# Patient Record
Sex: Male | Born: 1965 | Race: White | Hispanic: No | Marital: Married | Smoking: Never smoker
Health system: Southern US, Community
[De-identification: ages and names within clinical notes are randomized; demographics above are authoritative.]

## PROBLEM LIST (undated history)

## (undated) DIAGNOSIS — R42 Dizziness and giddiness: Secondary | ICD-10-CM

## (undated) DIAGNOSIS — I10 Essential (primary) hypertension: Secondary | ICD-10-CM

## (undated) DIAGNOSIS — E349 Endocrine disorder, unspecified: Secondary | ICD-10-CM

## (undated) DIAGNOSIS — G4482 Headache associated with sexual activity: Secondary | ICD-10-CM

## (undated) DIAGNOSIS — Z87442 Personal history of urinary calculi: Secondary | ICD-10-CM

## (undated) DIAGNOSIS — K589 Irritable bowel syndrome without diarrhea: Secondary | ICD-10-CM

## (undated) DIAGNOSIS — N200 Calculus of kidney: Secondary | ICD-10-CM

## (undated) DIAGNOSIS — R112 Nausea with vomiting, unspecified: Secondary | ICD-10-CM

## (undated) DIAGNOSIS — R51 Headache: Secondary | ICD-10-CM

## (undated) DIAGNOSIS — H9190 Unspecified hearing loss, unspecified ear: Secondary | ICD-10-CM

## (undated) DIAGNOSIS — G43109 Migraine with aura, not intractable, without status migrainosus: Secondary | ICD-10-CM

## (undated) HISTORY — DX: Dizziness and giddiness: R42

## (undated) HISTORY — DX: Headache: R51

## (undated) HISTORY — DX: Endocrine disorder, unspecified: E34.9

## (undated) HISTORY — PX: OTHER SURGICAL HISTORY: SHX169

## (undated) HISTORY — PX: ANTERIOR CRUCIATE LIGAMENT REPAIR: SHX115

## (undated) HISTORY — PX: HIP SURGERY: SHX245

## (undated) HISTORY — PX: KNEE SURGERY: SHX244

## (undated) HISTORY — DX: Essential (primary) hypertension: I10

## (undated) HISTORY — PX: EYE SURGERY: SHX253

## (undated) HISTORY — DX: Unspecified hearing loss, unspecified ear: H91.90

## (undated) HISTORY — DX: Headache associated with sexual activity: G44.82

## (undated) HISTORY — DX: Irritable bowel syndrome without diarrhea: K58.9

## (undated) HISTORY — DX: Calculus of kidney: N20.0

## (undated) HISTORY — PX: SHOULDER SURGERY: SHX246

## (undated) HISTORY — DX: Migraine with aura, not intractable, without status migrainosus: G43.109

---

## 2001-08-25 ENCOUNTER — Encounter: Payer: Self-pay | Admitting: Emergency Medicine

## 2001-08-25 ENCOUNTER — Emergency Department (HOSPITAL_COMMUNITY): Admission: AC | Admit: 2001-08-25 | Discharge: 2001-08-25 | Payer: Self-pay

## 2003-02-14 ENCOUNTER — Emergency Department (HOSPITAL_COMMUNITY): Admission: EM | Admit: 2003-02-14 | Discharge: 2003-02-14 | Payer: Self-pay | Admitting: Emergency Medicine

## 2003-02-14 ENCOUNTER — Encounter: Payer: Self-pay | Admitting: *Deleted

## 2003-02-19 ENCOUNTER — Ambulatory Visit (HOSPITAL_BASED_OUTPATIENT_CLINIC_OR_DEPARTMENT_OTHER): Admission: RE | Admit: 2003-02-19 | Discharge: 2003-02-19 | Payer: Self-pay | Admitting: Orthopedic Surgery

## 2004-10-28 ENCOUNTER — Encounter: Admission: RE | Admit: 2004-10-28 | Discharge: 2004-10-28 | Payer: Self-pay | Admitting: Gastroenterology

## 2004-11-24 ENCOUNTER — Ambulatory Visit (HOSPITAL_COMMUNITY): Admission: RE | Admit: 2004-11-24 | Discharge: 2004-11-24 | Payer: Self-pay | Admitting: Gastroenterology

## 2005-03-01 ENCOUNTER — Encounter: Admission: RE | Admit: 2005-03-01 | Discharge: 2005-03-01 | Payer: Self-pay | Admitting: Gastroenterology

## 2006-01-15 ENCOUNTER — Emergency Department (HOSPITAL_COMMUNITY): Admission: EM | Admit: 2006-01-15 | Discharge: 2006-01-15 | Payer: Self-pay | Admitting: Emergency Medicine

## 2006-01-16 ENCOUNTER — Inpatient Hospital Stay (HOSPITAL_COMMUNITY): Admission: EM | Admit: 2006-01-16 | Discharge: 2006-01-19 | Payer: Self-pay | Admitting: Emergency Medicine

## 2006-05-19 ENCOUNTER — Ambulatory Visit (HOSPITAL_COMMUNITY): Admission: EM | Admit: 2006-05-19 | Discharge: 2006-05-19 | Payer: Self-pay | Admitting: Emergency Medicine

## 2006-05-21 ENCOUNTER — Ambulatory Visit (HOSPITAL_COMMUNITY): Admission: AD | Admit: 2006-05-21 | Discharge: 2006-05-21 | Payer: Self-pay | Admitting: Urology

## 2006-05-28 ENCOUNTER — Emergency Department (HOSPITAL_COMMUNITY): Admission: EM | Admit: 2006-05-28 | Discharge: 2006-05-28 | Payer: Self-pay | Admitting: Emergency Medicine

## 2013-11-10 ENCOUNTER — Encounter: Payer: Self-pay | Admitting: Neurology

## 2013-11-11 ENCOUNTER — Ambulatory Visit (INDEPENDENT_AMBULATORY_CARE_PROVIDER_SITE_OTHER): Payer: No Typology Code available for payment source | Admitting: Neurology

## 2013-11-11 ENCOUNTER — Encounter: Payer: Self-pay | Admitting: Neurology

## 2013-11-11 ENCOUNTER — Encounter (INDEPENDENT_AMBULATORY_CARE_PROVIDER_SITE_OTHER): Payer: Self-pay

## 2013-11-11 VITALS — BP 139/79 | HR 66 | Ht 71.0 in | Wt 194.0 lb

## 2013-11-11 DIAGNOSIS — G4482 Headache associated with sexual activity: Secondary | ICD-10-CM

## 2013-11-11 DIAGNOSIS — R42 Dizziness and giddiness: Secondary | ICD-10-CM

## 2013-11-11 DIAGNOSIS — G43109 Migraine with aura, not intractable, without status migrainosus: Secondary | ICD-10-CM | POA: Insufficient documentation

## 2013-11-11 HISTORY — DX: Headache associated with sexual activity: G44.82

## 2013-11-11 HISTORY — DX: Migraine with aura, not intractable, without status migrainosus: G43.109

## 2013-11-11 MED ORDER — SUMATRIPTAN SUCCINATE 6 MG/0.5ML ~~LOC~~ SOLN: 6.0000 mg | Freq: Two times a day (BID) | SUBCUTANEOUS | Status: AC | PRN

## 2013-11-11 NOTE — Patient Instructions (Signed)
Migraine Headache A migraine headache is an intense, throbbing pain on one or both sides of your head. A migraine can last for 30 minutes to several hours. CAUSES  The exact cause of a migraine headache is not always known. However, a migraine may be caused when nerves in the brain become irritated and release chemicals that cause inflammation. This causes pain. Certain things may also trigger migraines, such as:  Alcohol.  Smoking.  Stress.  Menstruation.  Aged cheeses.  Foods or drinks that contain nitrates, glutamate, aspartame, or tyramine.  Lack of sleep.  Chocolate.  Caffeine.  Hunger.  Physical exertion.  Fatigue.  Medicines used to treat chest pain (nitroglycerine), birth control pills, estrogen, and some blood pressure medicines. SIGNS AND SYMPTOMS  Pain on one or both sides of your head.  Pulsating or throbbing pain.  Severe pain that prevents daily activities.  Pain that is aggravated by any physical activity.  Nausea, vomiting, or both.  Dizziness.  Pain with exposure to bright lights, loud noises, or activity.  General sensitivity to bright lights, loud noises, or smells. Before you get a migraine, you may get warning signs that a migraine is coming (aura). An aura may include:  Seeing flashing lights.  Seeing bright spots, halos, or zig-zag lines.  Having tunnel vision or blurred vision.  Having feelings of numbness or tingling.  Having trouble talking.  Having muscle weakness. DIAGNOSIS  A migraine headache is often diagnosed based on:  Symptoms.  Physical exam.  A CT scan or MRI of your head. These imaging tests cannot diagnose migraines, but they can help rule out other causes of headaches. TREATMENT Medicines may be given for pain and nausea. Medicines can also be given to help prevent recurrent migraines.  HOME CARE INSTRUCTIONS  Only take over-the-counter or prescription medicines for pain or discomfort as directed by your  health care provider. The use of long-term narcotics is not recommended.  Lie down in a dark, quiet room when you have a migraine.  Keep a journal to find out what may trigger your migraine headaches. For example, write down:  What you eat and drink.  How much sleep you get.  Any change to your diet or medicines.  Limit alcohol consumption.  Quit smoking if you smoke.  Get 7 9 hours of sleep, or as recommended by your health care provider.  Limit stress.  Keep lights dim if bright lights bother you and make your migraines worse. SEEK IMMEDIATE MEDICAL CARE IF:   Your migraine becomes severe.  You have a fever.  You have a stiff neck.  You have vision loss.  You have muscular weakness or loss of muscle control.  You start losing your balance or have trouble walking.  You feel faint or pass out.  You have severe symptoms that are different from your first symptoms. MAKE SURE YOU:   Understand these instructions.  Will watch your condition.  Will get help right away if you are not doing well or get worse. Document Released: 10/09/2005 Document Revised: 07/30/2013 Document Reviewed: 06/16/2013 [employer] Patient Information 2014 [employer].  

## 2013-11-11 NOTE — Progress Notes (Signed)
Reason for visit: Migraine headache  Asencion GowdaJeffrey D Belote is a 48 y.o. male  History of present illness:  Mr. Cleotis NipperHolliday is a 48 year old left-handed white male with a history of migraine headaches that have been occurring throughout most of his adult life. The patient indicates that the headaches may occur only 3 or 4 times a year, but the headaches are quite severe when they do occur. The patient has visual aura with the headache associated with a visual scotoma, and scintillating scotoma. The patient will then get a severe headache 10 or 15 minutes after the aura begins. The patient will have some tingling and numb sensations around the mouth, sometimes into the hands. The patient will have nausea and vomiting. The headaches respond somewhat to Relpax within about an hour, but the patient has a dull headache lasting 2 or 3 days. The patient has no definite activating factors, but flashing lights and certain odors may occasionally bring on headaches. Over the last 6 months, the patient began having coital headaches. This does not occur every time, but about 50% of the time he will get headaches during sex. The patient has also developed episodes of vertigo unassociated with headache that have occurred 4 or 5 times over the last 6-8 months. Episodes may last 5 minutes up to 2 hours. The patient has had some decreased hearing, and this has been documented with audiometric testing with a significant difference from one ear to the next. The patient is sent to this office for further evaluation. The patient indicates that his father also has migraine headache.  Past Medical History  Diagnosis Date  . Headache(784.0)   . Dizziness   . Hearing loss     asymmetrical  . Classic migraine with aura 11/11/2013  . Renal calculi     Lithotripsy  . 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 Irritable bowel syndrome   . Testosterone deficiency     Past Surgical History  Procedure Laterality Date  . Knee surgery Left       x2 left knee  . Anterior cruciate ligament repair      x2  . Hip surgery Left   . Shoulder surgery      Bilateral  . Thumb surgery      Tendon repair, right    Family History  Problem Relation Age of Onset  . High blood pressure Mother   . Hypertension Mother   . High blood pressure Father   . Cancer - Prostate Father   . Skin cancer Father   . Hypertension Father   . Hypertension Brother   . Diabetes Brother     Social history:  reports that he has never smoked. He has never used smokeless tobacco. He reports that he does not drink alcohol or use illicit drugs.  Medications:  No current outpatient prescriptions on file prior to visit.   No current facility-administered medications on file prior to visit.      Allergies  Allergen Reactions  . Percocet [Oxycodone-Acetaminophen]     ROS:  Out of a complete 14 system review of symptoms, the patient complains only of the following symptoms, and all other reviewed systems are negative.  Fatigue Hearing loss, ringing in the ears, vertigo Blurred vision Snoring Diarrhea, irritable bowel syndrome Urination problems Headache, dizziness Decreased energy  Blood pressure 139/79, pulse 66, height 5\' 11"  (1.803 m), weight 194 lb (87.998 kg).  Physical Exam  General: The patient is alert and cooperative at the time of the  examination.  Eyes: Pupils are equal, round, and reactive to light. Discs are flat bilaterally.  Neck: The neck is supple, no carotid bruits are noted.  Respiratory: The respiratory examination is clear.  Cardiovascular: The cardiovascular examination reveals a regular rate and rhythm, no obvious murmurs or rubs are noted.  Neuromuscular: Range of movement of the cervical spine is full.  Skin: Extremities are without significant edema.  Neurologic Exam  Mental status: The patient is alert and oriented x 3 at the time of the examination. The patient has apparent normal recent and remote  memory, with an apparently normal attention span and concentration ability.  Cranial nerves: Facial symmetry is present. There is good sensation of the face to pinprick and soft touch bilaterally. The strength of the facial muscles and the muscles to head turning and shoulder shrug are normal bilaterally. Speech is well enunciated, no aphasia or dysarthria is noted. Extraocular movements are full. Visual fields are full. The tongue is midline, and the patient has symmetric elevation of the soft palate. No obvious hearing deficits are noted.  Motor: The motor testing reveals 5 over 5 strength of all 4 extremities. Good symmetric motor tone is noted throughout.  Sensory: Sensory testing is intact to pinprick, soft touch, vibration sensation, and position sense on all 4 extremities. No evidence of extinction is noted.  Coordination: Cerebellar testing reveals good finger-nose-finger and heel-to-shin bilaterally.  Gait and station: Gait is normal. Tandem gait is normal. Romberg is negative. No drift is seen.  Reflexes: Deep tendon reflexes are symmetric and normal bilaterally. Toes are downgoing bilaterally.   Assessment/Plan:  1. Classic migraine  2. Coital headache  3. Vertigo, decreased hearing  Given the symptoms of vertigo and decreased hearing, the patient will undergo MRI evaluation of the brain with and without gadolinium enhancement. The patient will be placed on Imitrex injection for the headache, as this is more likely to be effective than the Relpax. The patient is not to take Relpax with the Imitrex. The patient will followup in 4 months. The patient may take Advil as well with the headache. The patient may pretreat for the coital headaches with Advil.  Marlan Palau MD 11/11/2013 6:59 PM  [facility] [address] [place], [place] [address]  Phone [phone] Fax [phone]

## 2014-03-11 ENCOUNTER — Ambulatory Visit: Payer: No Typology Code available for payment source | Admitting: Nurse Practitioner

## 2017-02-28 ENCOUNTER — Other Ambulatory Visit: Payer: Self-pay | Admitting: Urology

## 2017-03-02 ENCOUNTER — Encounter (HOSPITAL_COMMUNITY): Payer: Self-pay | Admitting: General Practice

## 2017-03-02 NOTE — H&P (Signed)
Office Visit Report     02/27/2017   --------------------------------------------------------------------------------   Gary Fitzgerald  MRN: [id number]  PRIMARY CARE:  Shirlean Mylar, MD  DOB: [birth year], 51 year old Male  REFERRING:    SSN: -[id number]  PROVIDER:  Barron Alvine, M.D.    TREATING:  Anne Fu    LOCATION:  [facility] [phone]   --------------------------------------------------------------------------------   CC/HPI: Recent labs show PSA of 1.16 and testosterone 558.7. He has been experiencing low libido/sex drive and fatigue.   H/o stones. Today he is experiencing left sided flank pain and pressure, dull in nature, in which he noticed Sunday evening. He has had to take left over pain medication left over from last kidney stone that Dr. Isabel Caprice prescribed. He has had lithortripsy twice while other times he has been able to pass on his own. Pt denies any fever, chills, or gross hematuria. He has not had any recent imaging.     CC: I have kidney stones.  HPI: Gary Fitzgerald is a 51 year-old male established patient who is here for renal calculi.  C/o constant left flank pain. States pain feels like a KS and he has passed 30-40 in the past. He will take Vicodin and Flomax at onset of symptoms. He endorses tolerable relief. Pain is non-radiating. Not associated with worsening increase in LUTS.   The problem is on the left side. He first stated noticing pain on approximately 02/24/2017. This is not his first kidney stone. He has had more than 5 stones prior to getting this one. He is currently having flank pain. He denies having back pain, groin pain, nausea, vomiting, fever, and chills. He has not caught a stone in his urine strainer since his symptoms began.   He has had eswl for treatment of his stones in the past.     CC: interval evaluation of low testosterone  HPI: Recent PSA 1.16 with testosterone 556.    The patient was last seen 08/2016. The  patient is currently taking 150 mg Testosterone/0.5ml weekly for his testosterone replacement therapy. The patient complains of decreased libido, fatigue, and weakness. Over the past 6 months the patient denies any progressive voiding symptoms including worsening frequency, urgency, or dysuria.     ALLERGIES: Darvocet-N 100 TABS Percocet TABS    MEDICATIONS: Flomax 0.4 mg capsule, ext release 24 hr 1 capsule PO Daily  Alprazolam 0.25 mg tablet Oral  Cialis 5 mg tablet 0 Oral  Luer-Lok Syringe-Needle 22 gauge x 1 1/2" syringe, empty disposable 0 Does Not Apply  Luer-Lok Syringe-Needle 23 gauge x 1 1/2" syringe, empty disposable 0 Does Not Apply  Relpax 40 mg tablet Oral  Self-Righting Luer Tip Cap each 0 Does Not Apply  Tamsulosin HCl - 0.4 MG Oral Capsule 0 Oral  Trazodone Hcl 50 mg tablet Oral  Viberzi 100 mg tablet Oral  Vicodin 5 mg-300 mg tablet Oral     GU PSH: None     PSH Notes: Shoulder Surgery, Hip Surgery Right, Knee Surgery Left   NON-GU PSH: None   GU PMH: Renal calculus, Nephrolithiasis - 01/03/2016 Primary hypogonadism, Hypogonadism, 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 Peddie has a history of nephrolithiasis. He has had over 20 stone events. He usually passes his stones spontaneously, but he has required lithotripsy on two occasions. CT  in June 2012 showed a 2 mm left distal ureteral stone which he eventually passed and many non obstructing stones bilaterally, all measuring around 1-3 mm.    He was diagnosed with "low testosterone" by his PCP. He had a lack of motivation and low libido. He tried Androgel, but was switched to Testim by his insurance company. He had his labs drawn after using the gel (1 pack daily) and states his testosterone was still low but in the normal range. His PCP has increased his dose to 2  packs per day for 1 month to see if this increases his levels further. He does notice a slight increase in motivation and has an increase in libido at night after he uses the product. His main concern is that the Testim is quite sticky. He has done reading online and thinks he is interested in testosterone injections     NON-GU PMH: Encounter for general adult medical examination without abnormal findings, Encounter for preventive health examination - 01/03/2016 Psychogenic ED, Erectile disorder, acquired, generalized, moderate - 01/03/2016, Erectile disorder, acquired, generalized, moderate, - 01/03/2016 Personal history of other diseases of the nervous system and sense organs, History of migraine headaches - 2015 Asthma, Asthma - 2014 Gastric ulcer, unspecified as acute or chronic, without hemorrhage or perforation, Gastric Ulcer - 2014 Irritable bowel syndrome with diarrhea, Irritable Bowel Syndrome - 2014 Personal history of other diseases of the digestive system, History of esophageal reflux - 2014 Personal history of other endocrine, nutritional and metabolic disease, History of hyperlipidemia - 2014    FAMILY HISTORY: Diabetes - Runs In Family Family Health Status Number - Runs In Family Urologic Disorder - Mother, Brother, Runs In Family   SOCIAL HISTORY: Marital Status: Married Current Smoking Status: Patient has never smoked.  Does not drink anymore.  Does not drink caffeine.     Notes: Never A Smoker, Alcohol Use, Marital History - Currently Married, Caffeine Use, Occupation:, Tobacco Use   REVIEW OF SYSTEMS:    GU Review Male:   Patient reports stream starts and stops and leakage of urine. Patient denies erection problems, burning/ pain with urination, get up at night to urinate, trouble starting your stream, have to strain to urinate , hard to postpone urination, frequent urination, and penile pain.  Gastrointestinal (Upper):   Patient denies nausea, vomiting, and indigestion/  heartburn.  Gastrointestinal (Lower):   Patient reports diarrhea. Patient denies constipation.  Constitutional:   Patient reports fatigue. Patient denies fever, night sweats, and weight loss.  Skin:   Patient denies skin rash/ lesion and itching.  Eyes:   Patient denies blurred vision and double vision.  Ears/ Nose/ Throat:   Patient denies sore throat and sinus problems.  Hematologic/Lymphatic:   Patient denies swollen glands and easy bruising.  Cardiovascular:   Patient denies leg swelling and chest pains.  Respiratory:   Patient denies cough and shortness of breath.  Endocrine:   Patient denies excessive thirst.  Musculoskeletal:   Patient denies back pain and joint pain.  Neurological:   Patient denies headaches and dizziness.  Psychologic:   Patient denies depression and anxiety.   VITAL SIGNS:      02/27/2017 10:19 AM  Weight 200 lb / 90.72 kg  BP 128/83 mmHg  Pulse 74 /min  Temperature 97.9 F / 37 C   MULTI-SYSTEM PHYSICAL EXAMINATION:    Constitutional: Well-nourished. No physical deformities. Normally developed. Good grooming.  Neck: Neck symmetrical, not swollen. Normal tracheal position.  Respiratory: No labored breathing, no  use of accessory muscles. CTA.  Cardiovascular: Normal temperature, normal extremity pulses, no swelling, no varicosities. RRR.  Lymphatic: No enlargement of neck, axillae, groin.  Skin: No paleness, no jaundice, no cyanosis. No lesion, no ulcer, no rash.  Neurologic / Psychiatric: Oriented to time, oriented to place, oriented to person. No depression, no anxiety, no agitation.  Gastrointestinal: Abdominal tenderness. No mass, no rigidity, non obese abdomen. Left mid flank tenderness.  Musculoskeletal: Spine, ribs, pelvis no bilateral tenderness. Normal gait and station of head and neck.     PAST DATA REVIEWED:  Source Of History:  Patient  Lab Test Review:   PSA, Total Testosterone  Records Review:   Previous Patient Records  Urine Test Review:    Urinalysis  X-Ray Review: KUB: Reviewed Films.     02/19/17 12/28/15 11/18/14 11/06/13  PSA  Total PSA 1.16 ng/dl 5.621.37  1.301.22  8.651.10     78/46/9604/30/18 08/18/16 12/28/15 05/22/15 11/17/14 05/12/14 11/06/13 05/08/13  Hormones  Testosterone, Total 558.7 pg/dL 295.2160.9 pg/dL 841337  324617  [id number]  [id number]  [id number]  827     02/27/17  Urinalysis  Urine Appearance Clear   Urine Specimen Voided   Urine Color Yellow   Urine Glucose Neg   Urine Bilirubin Neg   Urine Ketones Neg   Urine Specific Gravity 1.025   Urine Blood Trace Intact   Urine pH 6.0   Urine Protein Neg   Urine Urobilinogen 0.2   Urine Nitrites Neg   Urine Leukocyte Esterase Neg   Urine WBC/hpf 0 - 5/hpf   Urine RBC/hpf 0 - 2/hpf   Urine Epithelial Cells 0 - 5/hpf   Urine Bacteria Rare   Urine Mucous Present   Urine Yeast NS (Not Seen)   Urine Trichomonas Not Present   Urine Cystals NS (Not Seen)   Urine Casts NS (Not Seen)   Urine Sperm Not Present    PROCEDURES:         KUB - [id number]  A single view of the abdomen is obtained.  Calculi:  Proximal left ureter calculi. Left Ureteral calcification size: 5.465mm x 11mm.      Non-obstructing bilateral calculi noted. No obvious boney abnormalities. Bowel gas pattern wnl. Bladder appears free of obstruction.         Urinalysis w/Scope - 81001 Dipstick Dipstick Cont'd Micro  Specimen: Voided Bilirubin: Neg WBC/hpf: 0 - 5/hpf  Color: Yellow Ketones: Neg RBC/hpf: 0 - 2/hpf  Appearance: Clear Blood: Trace Intact Bacteria: Rare  Specific Gravity: 1.025 Protein: Neg Cystals: NS (Not Seen)  pH: 6.0 Urobilinogen: 0.2 Casts: NS (Not Seen)  Glucose: Neg Nitrites: Neg Trichomonas: Not Present    Leukocyte Esterase: Neg Mucous: Present      Epithelial Cells: 0 - 5/hpf      Yeast: NS (Not Seen)      Sperm: Not Present    ASSESSMENT:      ICD-10 Details  1 GU:   Renal calculus - N20.0   2   Renal colic - N23   3   Primary hypogonadism - E29.1    PLAN:            Medications New Meds:  Hydrocodone-Acetaminophen 7.5 mg-300 mg tablet 1 tablet PO Q 4 H   #20  0 Refill(s)    Refill Meds: Testosterone Cypionate 200 MG/ML Intramuscular Solution Inject 1.0 ml weekly as directed   #4  3 Refill(s)            Orders Labs Urine Culture  X-Rays: KUB  X-Ray Notes: ...          Schedule Return Visit/Planned Activity: ASAP - Schedule Surgery          Document Letter(s):  Created for Patient: Clinical Summary         Notes:   We discussed continuing observation vs procedural intervention of his obvious obstructing stone in the left proximal ureter. The stone in question is clearly visible and he is symptomatic. He has had lithotripsy in the past. I think he will be a good candidate for this moving forward especially given the limited surgical availability is urologist. We did discuss the complications of shockwave lithotripsy including need for additional procedures, hematoma, obstructing fragments, and ongoing pain. Understanding expressed and he has agreed to proceed with ESWL. I have provided additional analgesia. I'm fine with him continuing to take tamsulosin. Due to the acute nature of his KS symptoms, we did not get to focus much on hypogonadism today. Testosterone and PSA wnl. I'll have a refill faxed for him today. This can be addressed further at next f/u after ESWL.    * Signed by Anne Fu on 02/27/17 at 1:19 PM (EDT)*     The information contained in this medical record document is considered private and confidential patient information. This information can only be used for the medical diagnosis and/or medical services that are being provided by the patient's selected caregivers. This information can only be distributed outside of the patient's care if the patient agrees and signs waivers of authorization for this information to be sent to an outside source or route.

## 2017-03-05 ENCOUNTER — Ambulatory Visit (HOSPITAL_COMMUNITY)
Admission: RE | Admit: 2017-03-05 | Discharge: 2017-03-05 | Disposition: A | Discharge status: Home / Self Care | Payer: Managed Care, Other (non HMO) | Source: Ambulatory Visit | Attending: Urology | Admitting: Urology

## 2017-03-05 ENCOUNTER — Ambulatory Visit (HOSPITAL_COMMUNITY): Payer: Managed Care, Other (non HMO)

## 2017-03-05 ENCOUNTER — Encounter (HOSPITAL_COMMUNITY): Payer: Self-pay

## 2017-03-05 ENCOUNTER — Encounter (HOSPITAL_COMMUNITY)
Admission: RE | Disposition: A | Discharge status: Home / Self Care | Payer: Self-pay | Source: Ambulatory Visit | Attending: Urology

## 2017-03-05 DIAGNOSIS — Z79899 Other long term (current) drug therapy: Secondary | ICD-10-CM | POA: Insufficient documentation

## 2017-03-05 DIAGNOSIS — E291 Testicular hypofunction: Secondary | ICD-10-CM | POA: Diagnosis not present

## 2017-03-05 DIAGNOSIS — N200 Calculus of kidney: Secondary | ICD-10-CM | POA: Diagnosis not present

## 2017-03-05 DIAGNOSIS — N201 Calculus of ureter: Secondary | ICD-10-CM

## 2017-03-05 HISTORY — DX: Personal history of urinary calculi: Z87.442

## 2017-03-05 HISTORY — PX: EXTRACORPOREAL SHOCK WAVE LITHOTRIPSY: SHX1557

## 2017-03-05 SURGERY — LITHOTRIPSY, ESWL
Anesthesia: LOCAL | Laterality: Left

## 2017-03-05 MED ORDER — SODIUM CHLORIDE 0.9 % IV SOLN
INTRAVENOUS | Status: DC
  Administered 2017-03-05: 10:00:00 via INTRAVENOUS

## 2017-03-05 MED ORDER — CIPROFLOXACIN HCL 500 MG PO TABS
500.0000 mg | ORAL_TABLET | ORAL | Status: AC
  Administered 2017-03-05: 500 mg via ORAL
  Filled 2017-03-05: qty 1

## 2017-03-05 MED ORDER — DIAZEPAM 5 MG PO TABS
10.0000 mg | ORAL_TABLET | ORAL | Status: AC
  Administered 2017-03-05: 10 mg via ORAL
  Filled 2017-03-05: qty 2

## 2017-03-05 MED ORDER — DIPHENHYDRAMINE HCL 25 MG PO CAPS
25.0000 mg | ORAL_CAPSULE | ORAL | Status: AC
  Administered 2017-03-05: 25 mg via ORAL
  Filled 2017-03-05: qty 1

## 2017-03-05 NOTE — Interval H&P Note (Signed)
History and Physical Interval Note:  03/05/2017 12:16 PM  Asencion GowdaJeffrey D Hitz  has presented today for surgery, with the diagnosis of LEFT URETERAL CALCULI  The various methods of treatment have been discussed with the patient and family. After consideration of risks, benefits and other options for treatment, the patient has consented to  Procedure(s): LEFT EXTRACORPOREAL SHOCK WAVE LITHOTRIPSY (ESWL) (Left) as a surgical intervention .  The patient's history has been reviewed, patient examined, no change in status, stable for surgery. He continues to have left flank pain. Stone stable on KUB in left proximal ureter.  He denies dysuria or fever. I discussed with the patient the nature, potential benefits, risks and alternatives to left proximal ESWL, including side effects of the proposed treatment, the likelihood of the patient achieving the goals of the procedure, and any potential problems that might occur during the procedure or recuperation. Patient elects to proceed. He said he had a stent before and did not like the "retrieval". Discussed with patient ESWL will only treat the left proximal stone, it will not touch the left and right renal stones. I reviewed the KUB with him. I have reviewed the patient's chart and labs. Questions were answered to the patient's satisfaction.     Emmanual Gauthreaux

## 2017-03-05 NOTE — Op Note (Signed)
Left proximal ESWL   Left 10 mm proximal stone  Findings; Stone stable/present on KUB. Limited fragmentation despite max power to 4 and slowing rate down. May need staged procedure. At about 3200 shocks stone appeared to smudge and spread somewhat.

## 2017-03-05 NOTE — Discharge Instructions (Signed)
Lithotripsy, Care After °This sheet gives you information about how to care for yourself after your procedure. Your health care provider may also give you more specific instructions. If you have problems or questions, contact your health care provider. °What can I expect after the procedure? °After the procedure, it is common to have: °· Some blood in your urine. This should only last for a few days. °· Soreness in your back, sides, or upper abdomen for a few days. °· Blotches or bruises on your back where the pressure wave entered the skin. °· Pain, discomfort, or nausea when pieces (fragments) of the kidney stone move through the tube that carries urine from the kidney to the bladder (ureter). Stone fragments may pass soon after the procedure, but they may continue to pass for up to 4-8 weeks. °? If you have severe pain or nausea, contact your health care provider. This may be caused by a large stone that was not broken up, and this may mean that you need more treatment. °· Some pain or discomfort during urination. °· Some pain or discomfort in the lower abdomen or (in men) at the base of the penis. ° °Follow these instructions at home: °Medicines °· Take over-the-counter and prescription medicines only as told by your health care provider. °· If you were prescribed an antibiotic medicine, take it as told by your health care provider. Do not stop taking the antibiotic even if you start to feel better. °· Do not drive for 24 hours if you were given a medicine to help you relax (sedative). °· Do not drive or use heavy machinery while taking prescription pain medicine. °Eating and drinking °· Drink enough water and fluids to keep your urine clear or pale yellow. This helps any remaining pieces of the stone to pass. It can also help prevent new stones from forming. °· Eat plenty of fresh fruits and vegetables. °· Follow instructions from your health care provider about eating and drinking restrictions. You may be  instructed: °? To reduce how much salt (sodium) you eat or drink. Check ingredients and nutrition facts on packaged foods and beverages. °? To reduce how much meat you eat. °· Eat the recommended amount of calcium for your age and gender. Ask your health care provider how much calcium you should have. °General instructions °· Get plenty of rest. °· Most people can resume normal activities 1-2 days after the procedure. Ask your health care provider what activities are safe for you. °· If directed, strain all urine through the strainer that was provided by your health care provider. °? Keep all fragments for your health care provider to see. Any stones that are found may be sent to a medical lab for examination. The stone may be as small as a grain of salt. °· Keep all follow-up visits as told by your health care provider. This is important. °Contact a health care provider if: °· You have pain that is severe or does not get better with medicine. °· You have nausea that is severe or does not go away. °· You have blood in your urine longer than your health care provider told you to expect. °· You have more blood in your urine. °· You have pain during urination that does not go away. °· You urinate more frequently than usual and this does not go away. °· You develop a rash or any other possible signs of an allergic reaction. °Get help right away if: °· You have severe pain in   your back, sides, or upper abdomen. °· You have severe pain while urinating. °· Your urine is very dark red. °· You have blood in your stool (feces). °· You cannot pass any urine at all. °· You feel a strong urge to urinate after emptying your bladder. °· You have a fever or chills. °· You develop shortness of breath, difficulty breathing, or chest pain. °· You have severe nausea that leads to persistent vomiting. °· You faint. °Summary °· After this procedure, it is common to have some pain, discomfort, or nausea when pieces (fragments) of the  kidney stone move through the tube that carries urine from the kidney to the bladder (ureter). If this pain or nausea is severe, however, you should contact your health care provider. °· Most people can resume normal activities 1-2 days after the procedure. Ask your health care provider what activities are safe for you. °· Drink enough water and fluids to keep your urine clear or pale yellow. This helps any remaining pieces of the stone to pass, and it can help prevent new stones from forming. °· If directed, strain your urine and keep all fragments for your health care provider to see. Fragments or stones may be as small as a grain of salt. °· Get help right away if you have severe pain in your back, sides, or upper abdomen or have severe pain while urinating. °This information is not intended to replace advice given to you by your health care provider. Make sure you discuss any questions you have with your health care provider. °Document Released: 10/29/2007 Document Revised: 08/30/2016 Document Reviewed: 08/30/2016 °Elsevier Interactive Patient Education © 2017 [employer] Inc. ° °

## 2017-03-06 ENCOUNTER — Encounter (HOSPITAL_COMMUNITY): Payer: Self-pay | Admitting: Urology

## 2017-11-21 ENCOUNTER — Emergency Department (HOSPITAL_COMMUNITY): Payer: Managed Care, Other (non HMO)

## 2017-11-21 ENCOUNTER — Other Ambulatory Visit: Payer: Self-pay

## 2017-11-21 ENCOUNTER — Emergency Department (HOSPITAL_COMMUNITY)
Admission: EM | Admit: 2017-11-21 | Discharge: 2017-11-21 | Disposition: A | Discharge status: Home / Self Care | Payer: Managed Care, Other (non HMO) | Attending: Emergency Medicine | Admitting: Emergency Medicine

## 2017-11-21 ENCOUNTER — Encounter (HOSPITAL_COMMUNITY): Payer: Self-pay | Admitting: Emergency Medicine

## 2017-11-21 DIAGNOSIS — N200 Calculus of kidney: Secondary | ICD-10-CM | POA: Diagnosis not present

## 2017-11-21 DIAGNOSIS — Z79899 Other long term (current) drug therapy: Secondary | ICD-10-CM | POA: Insufficient documentation

## 2017-11-21 DIAGNOSIS — R109 Unspecified abdominal pain: Secondary | ICD-10-CM | POA: Diagnosis present

## 2017-11-21 LAB — URINALYSIS, ROUTINE W REFLEX MICROSCOPIC
BILIRUBIN URINE: NEGATIVE
GLUCOSE, UA: NEGATIVE mg/dL
KETONES UR: NEGATIVE mg/dL
LEUKOCYTES UA: NEGATIVE
NITRITE: NEGATIVE
PROTEIN: NEGATIVE mg/dL
Specific Gravity, Urine: 1.023 (ref 1.005–1.030)
Squamous Epithelial / LPF: NONE SEEN
pH: 5 (ref 5.0–8.0)

## 2017-11-21 LAB — BASIC METABOLIC PANEL
Anion gap: 8 (ref 5–15)
BUN: 15 mg/dL (ref 6–20)
CALCIUM: 9.2 mg/dL (ref 8.9–10.3)
CO2: 25 mmol/L (ref 22–32)
Chloride: 105 mmol/L (ref 101–111)
Creatinine, Ser: 1.23 mg/dL (ref 0.61–1.24)
GFR calc Af Amer: >60 mL/min (ref >60)
GLUCOSE: 119 mg/dL — AB (ref 65–99)
Potassium: 4.6 mmol/L (ref 3.5–5.1)
Sodium: 138 mmol/L (ref 135–145)

## 2017-11-21 LAB — CBC
HCT: 45.5 % (ref 39.0–52.0)
Hemoglobin: 16.2 g/dL (ref 13.0–17.0)
MCH: 32.1 pg (ref 26.0–34.0)
MCHC: 35.6 g/dL (ref 30.0–36.0)
MCV: 90.1 fL (ref 78.0–100.0)
Platelets: 213 10*3/uL (ref 150–400)
RBC: 5.05 MIL/uL (ref 4.22–5.81)
RDW: 13.2 % (ref 11.5–15.5)
WBC: 16.6 10*3/uL — ABNORMAL HIGH (ref 4.0–10.5)

## 2017-11-21 MED ORDER — HYDROCODONE-ACETAMINOPHEN 5-325 MG PO TABS: 2.0000 | ORAL_TABLET | Freq: Four times a day (QID) | ORAL | 0 refills | Status: DC | PRN

## 2017-11-21 MED ORDER — PROMETHAZINE HCL 25 MG/ML IJ SOLN
25.0000 mg | Freq: Once | INTRAMUSCULAR | Status: AC
  Administered 2017-11-21: 25 mg via INTRAVENOUS
  Filled 2017-11-21: qty 1

## 2017-11-21 MED ORDER — SODIUM CHLORIDE 0.9 % IV BOLUS (SEPSIS)
1000.0000 mL | Freq: Once | INTRAVENOUS | Status: AC
  Administered 2017-11-21: 1000 mL via INTRAVENOUS

## 2017-11-21 MED ORDER — PROMETHAZINE HCL 25 MG PO TABS: 25.0000 mg | ORAL_TABLET | Freq: Four times a day (QID) | ORAL | 0 refills | Status: AC | PRN

## 2017-11-21 MED ORDER — HYDROMORPHONE HCL 1 MG/ML IJ SOLN
1.0000 mg | Freq: Once | INTRAMUSCULAR | Status: AC
  Administered 2017-11-21: 1 mg via INTRAVENOUS
  Filled 2017-11-21: qty 1

## 2017-11-21 MED ORDER — SODIUM CHLORIDE 0.9 % IV BOLUS (SEPSIS)
1000.0000 mL | Freq: Once | INTRAVENOUS | Status: AC
Start: 2017-11-21 — End: 2017-11-21
  Administered 2017-11-21: 1000 mL via INTRAVENOUS

## 2017-11-21 MED ORDER — TAMSULOSIN HCL 0.4 MG PO CAPS: 0.4000 mg | ORAL_CAPSULE | Freq: Every day | ORAL | 0 refills | Status: AC

## 2017-11-21 MED ORDER — KETOROLAC TROMETHAMINE 30 MG/ML IJ SOLN
30.0000 mg | Freq: Once | INTRAMUSCULAR | Status: AC
  Administered 2017-11-21: 30 mg via INTRAVENOUS
  Filled 2017-11-21: qty 1

## 2017-11-21 NOTE — ED Provider Notes (Signed)
TIME SEEN: 4:34 AM  CHIEF COMPLAINT: Right flank pain  HPI: Patient is a 52 year old male with history of kidney stones status post lithotripsy x3 followed by Dr. Isabel CapriceGrapey with [facility] who presents to the emergency department with right flank pain.  States symptoms started 10 PM last night.  Has had nausea and vomiting.  Pain is sharp in nature without radiation.  Feels similar to his previous kidney stones.  No fevers, diarrhea, abdominal pain, dysuria or gross hematuria.  ROS: See HPI Constitutional: no fever  Eyes: no drainage  ENT: no runny nose   Cardiovascular:  no chest pain  Resp: no SOB  GI:  vomiting GU: no dysuria Integumentary: no rash  Allergy: no hives  Musculoskeletal: no leg swelling  Neurological: no slurred speech ROS otherwise negative  PAST MEDICAL HISTORY/PAST SURGICAL HISTORY:  Past Medical History:  Diagnosis Date  . Classic migraine with aura 11/11/2013  . 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 Dizziness   . Headache(784.0)   . Hearing loss    asymmetrical  . History of kidney stones   . Irritable bowel syndrome   . Renal calculi    Lithotripsy  . Testosterone deficiency     MEDICATIONS:  Prior to Admission medications   Medication Sig Start Date End Date Taking? Authorizing Provider  Eluxadoline (VIBERZI) 100 MG TABS Take 100 mg by mouth 2 (two) times daily.    [provider]  HYDROcodone-acetaminophen (NORCO) 10-325 MG per tablet Take 1 tablet by mouth every 6 (six) hours as needed.    [provider]  SUMAtriptan (IMITREX) 6 MG/0.5ML SOLN injection Inject 0.5 mLs (6 mg total) into the skin 2 (two) times daily as needed for migraine or headache. May repeat in 2 hours if headache persists or recurs. 11/11/13   York SpanielWillis, Charles K, MD  tamsulosin (FLOMAX) 0.4 MG CAPS capsule Take 0.4 mg by mouth daily as needed.  10/31/13   [provider]  testosterone cypionate (DEPOTESTOTERONE CYPIONATE) 200 MG/ML injection Inject 200 mg into  the muscle every 7 (seven) days.  10/31/13   [provider]    ALLERGIES:  Allergies  Allergen Reactions  . Darvon [Propoxyphene]   . Percocet [Oxycodone-Acetaminophen] Hives    SOCIAL HISTORY:  Social History   Tobacco Use  . Smoking status: Never Smoker  . Smokeless tobacco: Never Used  Substance Use Topics  . Alcohol use: No    FAMILY HISTORY: Family History  Problem Relation Age of Onset  . High blood pressure Mother   . Hypertension Mother   . High blood pressure Father   . Cancer - Prostate Father   . Skin cancer Father   . Hypertension Father   . Hypertension Brother   . Diabetes Brother     EXAM: BP (!) 154/100 (BP Location: Right Arm)   Pulse 76   Temp 98.6 F (37 C) (Oral)   Resp 20   SpO2 99%  CONSTITUTIONAL: Alert and oriented and responds appropriately to questions.  Appears uncomfortable, afebrile HEAD: Normocephalic EYES: Conjunctivae clear, pupils appear equal, EOMI ENT: normal nose; moist mucous membranes NECK: Supple, no meningismus, no nuchal rigidity, no LAD  CARD: RRR; S1 and S2 appreciated; no murmurs, no clicks, no rubs, no gallops RESP: Normal chest excursion without splinting or tachypnea; breath sounds clear and equal bilaterally; no wheezes, no rhonchi, no rales, no hypoxia or respiratory distress, speaking full sentences ABD/GI: Normal bowel sounds; non-distended; soft, non-tender, no rebound, no guarding, no peritoneal signs, no hepatosplenomegaly  BACK:  The back appears normal and is non-tender to palpation, there is no CVA tenderness EXT: Normal ROM in all joints; non-tender to palpation; no edema; normal capillary refill; no cyanosis, no calf tenderness or swelling    SKIN: Normal color for age and race; warm; no rash NEURO: Moves all extremities equally PSYCH: The patient's mood and manner are appropriate. Grooming and personal hygiene are appropriate.  MEDICAL DECISION MAKING: Patient here with right flank pain similar to  previous kidney stones.  Will obtain labs, x-ray, urine.  Will give IV fluids, Dilaudid, Toradol, Phenergan as he states this normally helps his symptoms.  ED PROGRESS: Labs show mild leukocytosis which is likely reactive.  X-ray shows a 5 mm calcification to the right of the L3 vertebra likely representing a mid ureteral stone.  Urine shows blood and no sign of infection.  Pain improved after 2 rounds of Dilaudid.  He does have some hydrocodone for home as well as Toradol.  Will discharge with another prescription for hydrocodone as well as Phenergan.  We will have him follow-up with his outpatient urologist.   At this time, I do not feel there is any life-threatening condition present. I have reviewed and discussed all results (EKG, imaging, lab, urine as appropriate) and exam findings with patient/family. I have reviewed nursing notes and appropriate previous records.  I feel the patient is safe to be discharged home without further emergent workup and can continue workup as an outpatient as needed. Discussed usual and customary return precautions. Patient/family verbalize understanding and are comfortable with this plan.  Outpatient follow-up has been provided if needed. All questions have been answered.     Antwone Capozzoli, Layla Maw, DO 11/21/17 [phone]

## 2017-11-21 NOTE — ED Triage Notes (Signed)
Pt is c/o right flank pain that started around 10pm tonight  Pt has had nausea and vomiting  Pt has hx of kidney stones

## 2017-11-22 ENCOUNTER — Other Ambulatory Visit: Payer: Self-pay

## 2017-11-22 ENCOUNTER — Other Ambulatory Visit: Payer: Self-pay | Admitting: Urology

## 2017-11-22 ENCOUNTER — Encounter (HOSPITAL_COMMUNITY): Payer: Self-pay | Admitting: *Deleted

## 2017-11-22 NOTE — Progress Notes (Signed)
Please sign lithotripsy orders under signed and held; pt to have procedure on Monday 11/26/2017. Thanks.

## 2017-11-25 NOTE — H&P (Signed)
Urology Preoperative H&P   Chief Complaint: Right flank pain  History of Present Illness: Gary Fitzgerald is a 52 y.o. male with a several day history of worsening right flank pain.  He was evaluated in the ED on 11/21/17 and was found to have a 5 mm right UPJ stone as well as multiple bilateral renal stones on KUB.  He has a long history of kidney stones that has required ESWL in the past.  Currently, his pain is controlled and he denies nausea/vomiting or fever/chills.  No voiding complaints.  Denies dysuria or hematuria.       Past Medical History:  Diagnosis Date  . Classic migraine with aura 11/11/2013  . 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 Dizziness   . Headache(784.0)   . Hearing loss    asymmetrical  . History of kidney stones   . Irritable bowel syndrome   . Renal calculi    Lithotripsy  . Testosterone deficiency     Past Surgical History:  Procedure Laterality Date  . ANTERIOR CRUCIATE LIGAMENT REPAIR     x2  . EXTRACORPOREAL SHOCK WAVE LITHOTRIPSY Left 03/05/2017   Procedure: LEFT EXTRACORPOREAL SHOCK WAVE LITHOTRIPSY (ESWL);  Surgeon: Jerilee Field, MD;  Location: [facility];  Service: Urology;  Laterality: Left;  . EYE SURGERY     lasik surgery bilat   . HIP SURGERY Right   . KNEE SURGERY Left    x2 left knee  . SHOULDER SURGERY     Bilateral  . Thumb surgery     Tendon repair, right    Allergies:  Allergies  Allergen Reactions  . Percocet [Oxycodone-Acetaminophen] Hives and Itching  . Darvon [Propoxyphene] Hives, Itching and Rash    Family History  Problem Relation Age of Onset  . High blood pressure Mother   . Hypertension Mother   . High blood pressure Father   . Cancer - Prostate Father   . Skin cancer Father   . Hypertension Father   . Hypertension Brother   . Diabetes Brother     Social History:  reports that  has never smoked. he has never used smokeless tobacco. He reports that he does not drink alcohol or use drugs.  ROS: A complete review  of systems was performed.  All systems are negative except for pertinent findings as noted.  Physical Exam:  Vital signs in last 24 hours:   Constitutional:  Alert and oriented, No acute distress Cardiovascular: Regular rate and rhythm, No JVD Respiratory: Normal respiratory effort, Lungs clear bilaterally GI: Abdomen is soft, nontender, nondistended, no abdominal masses GU: No CVA tenderness Lymphatic: No lymphadenopathy Neurologic: Grossly intact, no focal deficits Psychiatric: Normal mood and affect  Laboratory Data:  No results for input(s): WBC, HGB, HCT, PLT in the last 72 hours.  No results for input(s): NA, K, CL, GLUCOSE, BUN, CALCIUM, CREATININE in the last 72 hours.  Invalid input(s): CO3   No results found for this or any previous visit (from the past 24 hour(s)). No results found for this or any previous visit (from the past 240 hour(s)).  Renal Function: Recent Labs    11/21/17 0410  CREATININE 1.23   CrCl cannot be calculated (Unknown ideal weight.).  Radiologic Imaging: No results found.  I independently reviewed the above imaging studies.  Assessment and Plan Gary Fitzgerald is a 52 y.o. male with a 5 mm right UPJ stone and multiple bilateral renal stones  -The risks, benefits and alternatives of right ESWL was discussed with  the patient.  For shockwave lithotripsy I described the risks which include arrhythmia, kidney contusion, kidney hemorrhage, need for transfusion, back discomfort, flank ecchymosis, flank abrasion, inability to break up stone, inability to pass stone fragments, Steinstrasse, infection associated with obstructing stones, need for different surgical procedure and possible need for repeat shockwave lithotripsy.  The patient voices understanding.   Rhoderick Moodyhristopher Kisha Messman, MD 11/25/2017, 1:45 PM  [facility] Pager: [phone]

## 2017-11-26 ENCOUNTER — Ambulatory Visit (HOSPITAL_COMMUNITY): Payer: Managed Care, Other (non HMO)

## 2017-11-26 ENCOUNTER — Ambulatory Visit (HOSPITAL_COMMUNITY)
Admission: RE | Admit: 2017-11-26 | Discharge: 2017-11-26 | Disposition: A | Discharge status: Home / Self Care | Payer: Managed Care, Other (non HMO) | Source: Ambulatory Visit | Attending: Urology | Admitting: Urology

## 2017-11-26 ENCOUNTER — Encounter (HOSPITAL_COMMUNITY): Payer: Self-pay | Admitting: *Deleted

## 2017-11-26 ENCOUNTER — Other Ambulatory Visit: Payer: Self-pay

## 2017-11-26 ENCOUNTER — Encounter (HOSPITAL_COMMUNITY)
Admission: RE | Disposition: A | Discharge status: Home / Self Care | Payer: Self-pay | Source: Ambulatory Visit | Attending: Urology

## 2017-11-26 DIAGNOSIS — Z79899 Other long term (current) drug therapy: Secondary | ICD-10-CM | POA: Insufficient documentation

## 2017-11-26 DIAGNOSIS — Z87442 Personal history of urinary calculi: Secondary | ICD-10-CM | POA: Insufficient documentation

## 2017-11-26 DIAGNOSIS — N201 Calculus of ureter: Secondary | ICD-10-CM | POA: Diagnosis present

## 2017-11-26 DIAGNOSIS — K589 Irritable bowel syndrome without diarrhea: Secondary | ICD-10-CM | POA: Diagnosis not present

## 2017-11-26 DIAGNOSIS — Z885 Allergy status to narcotic agent status: Secondary | ICD-10-CM | POA: Insufficient documentation

## 2017-11-26 DIAGNOSIS — E291 Testicular hypofunction: Secondary | ICD-10-CM | POA: Diagnosis not present

## 2017-11-26 HISTORY — PX: EXTRACORPOREAL SHOCK WAVE LITHOTRIPSY: SHX1557

## 2017-11-26 SURGERY — LITHOTRIPSY, ESWL
Anesthesia: LOCAL | Laterality: Right

## 2017-11-26 MED ORDER — CIPROFLOXACIN HCL 500 MG PO TABS
500.0000 mg | ORAL_TABLET | ORAL | Status: AC
  Administered 2017-11-26: 500 mg via ORAL
  Filled 2017-11-26: qty 1

## 2017-11-26 MED ORDER — DIPHENHYDRAMINE HCL 25 MG PO CAPS
25.0000 mg | ORAL_CAPSULE | ORAL | Status: AC
  Administered 2017-11-26: 25 mg via ORAL
  Filled 2017-11-26: qty 1

## 2017-11-26 MED ORDER — LACTATED RINGERS IV SOLN
INTRAVENOUS | Status: DC
  Administered 2017-11-26: 11:00:00 via INTRAVENOUS

## 2017-11-26 MED ORDER — DIAZEPAM 5 MG PO TABS
10.0000 mg | ORAL_TABLET | ORAL | Status: AC
  Administered 2017-11-26: 10 mg via ORAL
  Filled 2017-11-26: qty 2

## 2017-11-26 MED ORDER — SODIUM CHLORIDE 0.9 % IV SOLN
INTRAVENOUS | Status: DC
  Administered 2017-11-26: 09:00:00 via INTRAVENOUS

## 2017-11-26 MED ORDER — HYDROMORPHONE HCL 1 MG/ML IJ SOLN
0.5000 mg | INTRAMUSCULAR | Status: DC | PRN
  Administered 2017-11-26 (×2): 0.5 mg via INTRAVENOUS
  Filled 2017-11-26: qty 1

## 2017-11-26 NOTE — Interval H&P Note (Signed)
History and Physical Interval Note:  11/26/2017 9:43 AM  Gary Fitzgerald  has presented today for surgery, with the diagnosis of RIGHT URETERAL STONE  The various methods of treatment have been discussed with the patient and family. After consideration of risks, benefits and other options for treatment, the patient has consented to  Procedure(s): RIGHT EXTRACORPOREAL SHOCK WAVE LITHOTRIPSY (ESWL) (Right) as a surgical intervention .  The patient's history has been reviewed, patient examined, no change in status, stable for surgery.  I have reviewed the patient's chart and labs.  Questions were answered to the patient's satisfaction.     Dorian Furnacehristopher Aaron Ireland Chagnon

## 2017-11-26 NOTE — Discharge Instructions (Signed)
Moderate Conscious Sedation, Adult, Care After °These instructions provide you with information about caring for yourself after your procedure. Your health care provider may also give you more specific instructions. Your treatment has been planned according to current medical practices, but problems sometimes occur. Call your health care provider if you have any problems or questions after your procedure. °What can I expect after the procedure? °After your procedure, it is common: °To feel sleepy for several hours. °To feel clumsy and have poor balance for several hours. °To have poor judgment for several hours. °To vomit if you eat too soon. ° °Follow these instructions at home: °For at least 24 hours after the procedure: ° °Do not: °Participate in activities where you could fall or become injured. °Drive. °Use heavy machinery. °Drink alcohol. °Take sleeping pills or medicines that cause drowsiness. °Make important decisions or sign legal documents. °Take care of children on your own. °Rest. °Eating and drinking °Follow the diet recommended by your health care provider. °If you vomit: °Drink water, juice, or soup when you can drink without vomiting. °Make sure you have little or no nausea before eating solid foods. °General instructions °Have a responsible adult stay with you until you are awake and alert. °Take over-the-counter and prescription medicines only as told by your health care provider. °If you smoke, do not smoke without supervision. °Keep all follow-up visits as told by your health care provider. This is important. °Contact a health care provider if: °You keep feeling nauseous or you keep vomiting. °You feel light-headed. °You develop a rash. °You have a fever. °Get help right away if: °You have trouble breathing. °This information is not intended to replace advice given to you by your health care provider. Make sure you discuss any questions you have with your health care provider. °Document Released:  07/30/2013 Document Revised: 03/13/2016 Document Reviewed: 01/29/2016 °Elsevier Interactive Patient Education © 2018 [employer] °Lithotripsy, Care After °This sheet gives you information about how to care for yourself after your procedure. Your health care provider may also give you more specific instructions. If you have problems or questions, contact your health care provider. °What can I expect after the procedure? °After the procedure, it is common to have: °· Some blood in your urine. This should only last for a few days. °· Soreness in your back, sides, or upper abdomen for a few days. °· Blotches or bruises on your back where the pressure wave entered the skin. °· Pain, discomfort, or nausea when pieces (fragments) of the kidney stone move through the tube that carries urine from the kidney to the bladder (ureter). Stone fragments may pass soon after the procedure, but they may continue to pass for up to 4-8 weeks. °? If you have severe pain or nausea, contact your health care provider. This may be caused by a large stone that was not broken up, and this may mean that you need more treatment. °· Some pain or discomfort during urination. °· Some pain or discomfort in the lower abdomen or (in men) at the base of the penis. ° °Follow these instructions at home: °Medicines °· Take over-the-counter and prescription medicines only as told by your health care provider. °· If you were prescribed an antibiotic medicine, take it as told by your health care provider. Do not stop taking the antibiotic even if you start to feel better. °· Do not drive for 24 hours if you were given a medicine to help you relax (sedative). °· Do not drive   or use heavy machinery while taking prescription pain medicine. °Eating and drinking °· Drink enough water and fluids to keep your urine clear or pale yellow. This helps any remaining pieces of the stone to pass. It can also help prevent new stones from forming. °· Eat plenty of fresh  fruits and vegetables. °· Follow instructions from your health care provider about eating and drinking restrictions. You may be instructed: °? To reduce how much salt (sodium) you eat or drink. Check ingredients and nutrition facts on packaged foods and beverages. °? To reduce how much meat you eat. °· Eat the recommended amount of calcium for your age and gender. Ask your health care provider how much calcium you should have. °General instructions °· Get plenty of rest. °· Most people can resume normal activities 1-2 days after the procedure. Ask your health care provider what activities are safe for you. °· If directed, strain all urine through the strainer that was provided by your health care provider. °? Keep all fragments for your health care provider to see. Any stones that are found may be sent to a medical lab for examination. The stone may be as small as a grain of salt. °· Keep all follow-up visits as told by your health care provider. This is important. °Contact a health care provider if: °· You have pain that is severe or does not get better with medicine. °· You have nausea that is severe or does not go away. °· You have blood in your urine longer than your health care provider told you to expect. °· You have more blood in your urine. °· You have pain during urination that does not go away. °· You urinate more frequently than usual and this does not go away. °· You develop a rash or any other possible signs of an allergic reaction. °Get help right away if: °· You have severe pain in your back, sides, or upper abdomen. °· You have severe pain while urinating. °· Your urine is very dark red. °· You have blood in your stool (feces). °· You cannot pass any urine at all. °· You feel a strong urge to urinate after emptying your bladder. °· You have a fever or chills. °· You develop shortness of breath, difficulty breathing, or chest pain. °· You have severe nausea that leads to persistent vomiting. °· You  faint. °Summary °· After this procedure, it is common to have some pain, discomfort, or nausea when pieces (fragments) of the kidney stone move through the tube that carries urine from the kidney to the bladder (ureter). If this pain or nausea is severe, however, you should contact your health care provider. °· Most people can resume normal activities 1-2 days after the procedure. Ask your health care provider what activities are safe for you. °· Drink enough water and fluids to keep your urine clear or pale yellow. This helps any remaining pieces of the stone to pass, and it can help prevent new stones from forming. °· If directed, strain your urine and keep all fragments for your health care provider to see. Fragments or stones may be as small as a grain of salt. °· Get help right away if you have severe pain in your back, sides, or upper abdomen or have severe pain while urinating. °This information is not intended to replace advice given to you by your health care provider. Make sure you discuss any questions you have with your health care provider. °Document Released: 10/29/2007 Document Revised:   08/30/2016 Document Reviewed: 08/30/2016 °Elsevier Interactive Patient Education © 2018 [employer]. ° °

## 2017-11-26 NOTE — Op Note (Signed)
See [facility] OP note scanned into chart. Also because of the size, density, location and other factors that cannot be anticipated I feel this will likely be a staged procedure. This fact supersedes any indication in the scanned [facility] operative note to the contrary.  

## 2017-11-27 ENCOUNTER — Encounter (HOSPITAL_COMMUNITY): Payer: Self-pay | Admitting: Urology

## 2018-10-01 ENCOUNTER — Ambulatory Visit (INDEPENDENT_AMBULATORY_CARE_PROVIDER_SITE_OTHER): Payer: Self-pay

## 2018-10-01 ENCOUNTER — Ambulatory Visit (INDEPENDENT_AMBULATORY_CARE_PROVIDER_SITE_OTHER): Payer: BLUE CROSS/BLUE SHIELD | Admitting: Orthopaedic Surgery

## 2018-10-01 ENCOUNTER — Encounter (INDEPENDENT_AMBULATORY_CARE_PROVIDER_SITE_OTHER): Payer: Self-pay | Admitting: Orthopaedic Surgery

## 2018-10-01 ENCOUNTER — Encounter (INDEPENDENT_AMBULATORY_CARE_PROVIDER_SITE_OTHER): Payer: Self-pay

## 2018-10-01 VITALS — BP 117/69 | HR 63 | Resp 14 | Ht 70.0 in | Wt 179.0 lb

## 2018-10-01 DIAGNOSIS — M25512 Pain in left shoulder: Secondary | ICD-10-CM | POA: Diagnosis not present

## 2018-10-01 DIAGNOSIS — G8929 Other chronic pain: Secondary | ICD-10-CM | POA: Diagnosis not present

## 2018-10-01 DIAGNOSIS — M25511 Pain in right shoulder: Secondary | ICD-10-CM

## 2018-10-01 DIAGNOSIS — M545 Low back pain: Secondary | ICD-10-CM

## 2018-10-01 MED ORDER — BUPIVACAINE HCL 0.5 % IJ SOLN
2.0000 mL | INTRAMUSCULAR | Status: AC | PRN
  Administered 2018-10-01: 2 mL via INTRA_ARTICULAR

## 2018-10-01 MED ORDER — LIDOCAINE HCL 2 % IJ SOLN
2.0000 mL | INTRAMUSCULAR | Status: AC | PRN
  Administered 2018-10-01: 2 mL

## 2018-10-01 MED ORDER — METHYLPREDNISOLONE ACETATE 40 MG/ML IJ SUSP
80.0000 mg | INTRAMUSCULAR | Status: AC | PRN
  Administered 2018-10-01: 80 mg

## 2018-10-01 NOTE — Progress Notes (Signed)
Office Visit Note   Patient: Gary Fitzgerald           Date of Birth: 06-21-1966           MRN: [id number] Visit Date: 10/01/2018              Requested by: Gary MylarWebb, Carol, MD [address] [place], [place] [address] PCP: Gary Fitzgerald, Gary R, PA-C   Assessment & Plan: Visit Diagnoses:  1. Chronic pain of both shoulders   2. Chronic midline low back pain without sciatica     Plan: Impingement syndrome left greater than right shoulder.  Will inject subacromial space left shoulder and monitor response.  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  Have suggested NSAIDs and exercises.  If no improvement in shoulder pain within the next several weeks will call and will consider MRI scan  Follow-Up Instructions: No follow-ups on file.   Orders:  Orders Placed This Encounter  Procedures  . XR Shoulder Left  . XR Shoulder Right  . XR Lumbar Spine 2-3 Views   No orders of the defined types were placed in this encounter.     Procedures: Large Joint Inj: L subacromial bursa on 10/01/2018 2:26 PM Indications: pain and diagnostic evaluation Details: 25 G 1.5 in needle, anterolateral approach  Arthrogram: No  Medications: 2 mL lidocaine 2 %; 2 mL bupivacaine 0.5 %; 80 mg methylPREDNISolone acetate 40 MG/ML Consent was given by the patient. Immediately prior to procedure a time out was called to verify the correct patient, procedure, equipment, support staff and site/side marked as required. Patient was prepped and draped in the usual sterile fashion.       Clinical Data: No additional findings.   Subjective: Chief Complaint  Patient presents with  . Right Shoulder - Pain  . Left Shoulder - Pain  . Lower Back - Pain  . Shoulder Pain    Bil shoulder pain, left shoulder worse than right, 5 years - worsening, Bil arthroscopic surgery - 2001, 2002 Dr. Cleophas DunkerWhitfield, limited range of motion, no injury,   . Back Pain    Low back  pain x 5 years, no injury, no back surgery, works [employer], difficulty sleeping at night  Is a 49-year follow-up visit evaluation of bilateral shoulder and low back pain.  His past history is significant that he underwent arthroscopic debridement of both shoulders nearly 20 years ago in staged procedures.  He is done well up until the last year or 2 when he is had some recurrent pain without injury or trauma.  He is having with more trouble on the left than the right side.  He is left-hand dominant.  He has had some difficulty with pain with overhead motion and sleeping on that side.  He is not aware of any particular injury or inciting event.  No referred pain to either upper extremity or numbness or tingling  He also has a history of degenerative disc disease of lumbar spine diagnosed many years ago.  He has had a bit more discomfort recently with pain referred from his back to the superior buttock but no numbness or tingling referred pain to either lower extremity.  No bowel or bladder dysfunction.  HPI  Review of Systems  Constitutional: Negative for fatigue.  HENT: Positive for trouble swallowing.   Eyes: Negative for pain.  Respiratory: Negative for shortness of breath.   Cardiovascular: Negative for leg swelling.  Gastrointestinal: Positive for constipation and diarrhea.  Endocrine: Negative for cold intolerance.  Genitourinary: Positive for difficulty urinating.  Musculoskeletal: Positive for back pain and neck stiffness.  Skin: Negative for rash.  Allergic/Immunologic: Positive for food allergies.  Neurological: Negative for weakness.  Hematological: Does not bruise/bleed easily.  Psychiatric/Behavioral: Positive for sleep disturbance.     Objective: Vital Signs: BP 117/69 (BP Location: Right Arm, Patient Position: Sitting, Cuff Size: Normal)   Pulse 63   Resp 14   Ht 5\' 10"  (1.778 m)   Wt 179 lb (81.2 kg)   BMI 25.68 kg/m   Physical Exam  Constitutional: He is  oriented to person, place, and time. He appears well-developed and well-nourished.  HENT:  Mouth/Throat: Oropharynx is clear and moist.  Eyes: Pupils are equal, round, and reactive to light. EOM are normal.  Pulmonary/Chest: Effort normal.  Neurological: He is alert and oriented to person, place, and time.  Skin: Skin is warm and dry.  Psychiatric: He has a normal mood and affect. His behavior is normal.    Ortho Exam awake alert and oriented x3.  Comfortable sitting.  Straight leg raise negative bilaterally.  Painless range of motion both hips and both knees.  Neurologically intact.  No significant percussible tenderness lumbar spine.  Walks without a limp.  Stands erect without obvious spasm.   Positive impingement syndrome left shoulder and extreme of external rotation.  Definitely positive empty can testing.  Good strength.  Mild tenderness in the anterior subacromial region.  No evidence of instability or adhesive capsulitis.  Able to place his arm fully over his head with normal internal rotation  Specialty Comments:  No specialty comments available.  Imaging: No results found.   PMFS History: Patient Active Problem List   Diagnosis Date Noted  . Classic migraine with aura 11/11/2013  . Dizziness and giddiness 11/11/2013  . Coital headache 11/11/2013   Past Medical History:  Diagnosis Date  . Classic migraine with aura 11/11/2013  . 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 Dizziness   . Headache(784.0)   . Hearing loss    asymmetrical  . History of kidney stones   . Hypertension   . Irritable bowel syndrome   . Renal calculi    Lithotripsy  . Testosterone deficiency     Family History  Problem Relation Age of Onset  . High blood pressure Mother   . Hypertension Mother   . High blood pressure Father   . Cancer - Prostate Father   . Skin cancer Father   . Hypertension Father   . Hypertension Brother   . Diabetes Brother     Past Surgical History:  Procedure Laterality  Date  . ANTERIOR CRUCIATE LIGAMENT REPAIR     x2  . EXTRACORPOREAL SHOCK WAVE LITHOTRIPSY Left 03/05/2017   Procedure: LEFT EXTRACORPOREAL SHOCK WAVE LITHOTRIPSY (ESWL);  Surgeon: Jerilee Field, MD;  Location: [facility];  Service: Urology;  Laterality: Left;  . EXTRACORPOREAL SHOCK WAVE LITHOTRIPSY Right 11/26/2017   Procedure: RIGHT EXTRACORPOREAL SHOCK WAVE LITHOTRIPSY (ESWL);  Surgeon: Rene Paci, MD;  Location: [facility];  Service: Urology;  Laterality: Right;  . EYE SURGERY     lasik surgery bilat   . HIP SURGERY Right   . KNEE SURGERY Left    x2 left knee  . SHOULDER SURGERY     Bilateral  . Thumb surgery     Tendon repair, right   Social History   Occupational History    Comment: [employer]  Tobacco Use  . Smoking status: Never Smoker  .  Smokeless tobacco: Never Used  Substance and Sexual Activity  . Alcohol use: No  . Drug use: Not Currently  . Sexual activity: Not on file

## 2018-10-01 NOTE — Patient Instructions (Signed)
Back Exercises  The following exercises strengthen the muscles that help to support the back. They also help to keep the lower back flexible. Doing these exercises can help to prevent back pain or lessen existing pain.  If you have back pain or discomfort, try doing these exercises 2-3 times each day or as told by your health care provider. When the pain goes away, do them once each day, but increase the number of times that you repeat the steps for each exercise (do more repetitions). If you do not have back pain or discomfort, do these exercises once each day or as told by your health care provider.  Exercises  Single Knee to Chest    Repeat these steps 3-5 times for each leg:  1. Lie on your back on a firm bed or the floor with your legs extended.  2. Bring one knee to your chest. Your other leg should stay extended and in contact with the floor.  3. Hold your knee in place by grabbing your knee or thigh.  4. Pull on your knee until you feel a gentle stretch in your lower back.  5. Hold the stretch for 10-30 seconds.  6. Slowly release and straighten your leg.    Pelvic Tilt    Repeat these steps 5-10 times:  1. Lie on your back on a firm bed or the floor with your legs extended.  2. Bend your knees so they are pointing toward the ceiling and your feet are flat on the floor.  3. Tighten your lower abdominal muscles to press your lower back against the floor. This motion will tilt your pelvis so your tailbone points up toward the ceiling instead of pointing to your feet or the floor.  4. With gentle tension and even breathing, hold this position for 5-10 seconds.    Cat-Cow    Repeat these steps until your lower back becomes more flexible:  1. Get into a hands-and-knees position on a firm surface. Keep your hands under your shoulders, and keep your knees under your hips. You may place padding under your knees for comfort.  2. Let your head hang down, and point your tailbone toward the floor so your lower back  becomes rounded like the back of a cat.  3. Hold this position for 5 seconds.  4. Slowly lift your head and point your tailbone up toward the ceiling so your back forms a sagging arch like the back of a cow.  5. Hold this position for 5 seconds.    Press-Ups    Repeat these steps 5-10 times:  1. Lie on your abdomen (face-down) on the floor.  2. Place your palms near your head, about shoulder-width apart.  3. While you keep your back as relaxed as possible and keep your hips on the floor, slowly straighten your arms to raise the top half of your body and lift your shoulders. Do not use your back muscles to raise your upper torso. You may adjust the placement of your hands to make yourself more comfortable.  4. Hold this position for 5 seconds while you keep your back relaxed.  5. Slowly return to lying flat on the floor.    Bridges    Repeat these steps 10 times:  1. Lie on your back on a firm surface.  2. Bend your knees so they are pointing toward the ceiling and your feet are flat on the floor.  3. Tighten your buttocks muscles and lift your buttocks   off of the floor until your waist is at almost the same height as your knees. You should feel the muscles working in your buttocks and the back of your thighs. If you do not feel these muscles, slide your feet 1-2 inches farther away from your buttocks.  4. Hold this position for 3-5 seconds.  5. Slowly lower your hips to the starting position, and allow your buttocks muscles to relax completely.    If this exercise is too easy, try doing it with your arms crossed over your chest.  Abdominal Crunches    Repeat these steps 5-10 times:  1. Lie on your back on a firm bed or the floor with your legs extended.  2. Bend your knees so they are pointing toward the ceiling and your feet are flat on the floor.  3. Cross your arms over your chest.  4. Tip your chin slightly toward your chest without bending your neck.  5. Tighten your abdominal muscles and slowly raise your  trunk (torso) high enough to lift your shoulder blades a tiny bit off of the floor. Avoid raising your torso higher than that, because it can put too much stress on your low back and it does not help to strengthen your abdominal muscles.  6. Slowly return to your starting position.    Back Lifts  Repeat these steps 5-10 times:  1. Lie on your abdomen (face-down) with your arms at your sides, and rest your forehead on the floor.  2. Tighten the muscles in your legs and your buttocks.  3. Slowly lift your chest off of the floor while you keep your hips pressed to the floor. Keep the back of your head in line with the curve in your back. Your eyes should be looking at the floor.  4. Hold this position for 3-5 seconds.  5. Slowly return to your starting position.    Contact a health care provider if:  · Your back pain or discomfort gets much worse when you do an exercise.  · Your back pain or discomfort does not lessen within 2 hours after you exercise.  If you have any of these problems, stop doing these exercises right away. Do not do them again unless your health care provider says that you can.  Get help right away if:  · You develop sudden, severe back pain. If this happens, stop doing the exercises right away. Do not do them again unless your health care provider says that you can.  This information is not intended to replace advice given to you by your health care provider. Make sure you discuss any questions you have with your health care provider.  Document Released: 11/16/2004 Document Revised: 02/16/2016 Document Reviewed: 12/03/2014  Elsevier Interactive Patient Education © 2017 [employer]    Shoulder Range of Motion Exercises  Shoulder range of motion (ROM) exercises are designed to keep the shoulder moving freely. They are often recommended for people who have shoulder pain.  Phase 1 exercises  When you are able, do this exercise 5-6 days per week, or as told by your health care provider. Work toward  doing 2 sets of 10 swings.  Pendulum Exercise  How To Do This Exercise Lying Down  1. Lie face-down on a bed with your abdomen close to the side of the bed.  2. Let your arm hang over the side of the bed.  3. Relax your shoulder, arm, and hand.  4. Slowly and gently swing your arm   forward and back. Do not use your neck muscles to swing your arm. They should be relaxed. If you are struggling to swing your arm, have someone gently swing it for you. When you do this exercise for the first time, swing your arm at a 15 degree angle for 15 seconds, or swing your arm 10 times. As pain lessens over time, increase the angle of the swing to 30-45 degrees.  5. Repeat steps 1-4 with the other arm.    How To Do This Exercise While Standing  1. Stand next to a sturdy chair or table and hold on to it with your hand.  1. Bend forward at the waist.  2. Bend your knees slightly.  3. Relax your other arm and let it hang limp.  4. Relax the shoulder blade of the arm that is hanging and let it drop.  5. While keeping your shoulder relaxed, use body motion to swing your arm in small circles. The first time you do this exercise, swing your arm for about 30 seconds or 10 times. When you do it next time, swing your arm for a little longer.  6. Stand up tall and relax.  7. Repeat steps 1-7, this time changing the direction of the circles.  2. Repeat steps 1-8 with the other arm.    Phase 2 exercises  Do these exercises 3-4 times per day on 5-6 days per week or as told by your health care provider. Work toward holding the stretch for 20 seconds.  Stretching Exercise 1  1. Lift your arm straight out in front of you.  2. Bend your arm 90 degrees at the elbow (right angle) so your forearm goes across your body and looks like the letter "L."  3. Use your other arm to gently pull the elbow forward and across your body.  4. Repeat steps 1-3 with the other arm.  Stretching Exercise 2  You will need a towel or rope for this exercise.  1. Bend one arm  behind your back with the palm facing outward.  2. Hold a towel with your other hand.  3. Reach the arm that holds the towel above your head, and bend that arm at the elbow. Your wrist should be behind your neck.  4. Use your free hand to grab the free end of the towel.  5. With the higher hand, gently pull the towel up behind you.  6. With the lower hand, pull the towel down behind you.  7. Repeat steps 1-6 with the other arm.    Phase 3 exercises  Do each of these exercises at four different times of day (sessions) every day or as told by your health care provider. To begin with, repeat each exercise 5 times (repetitions). Work toward doing 3 sets of 12 repetitions or as told by your health care provider.  Strengthening Exercise 1  You will need a light weight for this activity. As you grow stronger, you may use a heavier weight.  1. Standing with a weight in your hand, lift your arm straight out to the side until it is at the same height as your shoulder.  2. Bend your arm at 90 degrees so that your fingers are pointing to the ceiling.  3. Slowly raise your hand until your arm is straight up in the air.  4. Repeat steps 1-3 with the other arm.    Strengthening Exercise 2  You will need a light weight for this activity. As   you grow stronger, you may use a heavier weight.  1. Standing with a weight in your hand, gradually move your straight arm in an arc, starting at your side, then out in front of you, then straight up over your head.  2. Gradually move your other arm in an arc, starting at your side, then out in front of you, then straight up over your head.  3. Repeat steps 1-2 with the other arm.    Strengthening Exercise 3  You will need an elastic band for this activity. As you grow stronger, gradually increase the size of the bands or increase the number of bands that you use at one time.  1. While standing, hold an elastic band in one hand and raise that arm up in the air.  2. With your other hand, pull  down the band until that hand is by your side.  3. Repeat steps 1-2 with the other arm.    This information is not intended to replace advice given to you by your health care provider. Make sure you discuss any questions you have with your health care provider.  Document Released: 07/08/2003 Document Revised: 06/04/2016 Document Reviewed: 10/05/2014  Elsevier Interactive Patient Education © 2018 [employer]

## 2018-11-25 ENCOUNTER — Other Ambulatory Visit: Payer: Self-pay | Admitting: Urology

## 2018-12-01 NOTE — H&P (Signed)
HPI: [name] is a 53 year old male with a long history of nephrolithiasis, BPH (tx w/ tamsulosin), IBS and hypogonadism (currently treated with IMTC 0.5 mL q week and anastrozole 1 mg daily). He presents today for ESWL  Labs 11/14/2018:  Last testosterone-1182.5  Last hematocrit-44.7   The patient states that he passed several small kidney stones approximately 1 month ago. He denies any residual sequela today. He is voiding without difficulty denies dysuria, hematuria, flank pain, nausea/vomiting or fever/chills.   He was recently diagnosed and treated for hypertension. He was started on several blood pressure medications, which had an immediate impact on his erectile function. He states that he is able to get morning erections, but is having a difficult time getting spontaneous erections throughout the day. He does not use PDE5i due to severe migraines.   He states that he still has a good amount of energy and libido has started working out more frequently. He states that his last approximately 25 pounds over the past 6 months.      ALLERGIES: Darvocet-N 100 TABS - Hives Percocet TABS - Hives    MEDICATIONS: Ketorolac Tromethamine 10 mg tablet 1 tablet PO Q 6 H PRN  Tamsulosin Hcl 0.4 mg capsule 1 capsule PO Daily  Testosterone Cypionate 200 mg/ml vial INJECT 1MLS WEEKLY AS DIRECTED  Alprazolam 0.25 mg tablet Oral  Amlodipine Besylate  Anastrozole 1 mg tablet 1 tablet PO Daily  Claritin  Flomax 0.4 mg capsule  Hydrocodone-Acetaminophen 5 mg-325 mg tablet 1 tablet PO PRN  Luer-Lok Syringe-Needle 22 gauge x 1 1/2" syringe, empty disposable 0 Does Not Apply  Luer-Lok Syringe-Needle 23 gauge x 1 1/2" syringe, empty disposable 0 Does Not Apply  Potassium Citrate Er 15 meq (1,620 mg) tablet, extended release 1 tablet PO BID  Potassium Citrate Er 10 meq (1,080 mg) tablet, extended release 1 tablet PO Daily  Viberzi 100 mg tablet Oral     GU PSH: ESWL - 11/26/2017, Left - 03/05/2017     NON-GU PSH: Hip Arthroscopy/surgery, Right Knee Arthroscopy, Left Shoulder Surgery (Unspecified)    GU PMH: Flank Pain, Left - 03/28/2018, (Acute), Right, Ketorolac 60 mg IM today. Ketorolac 10 mg 1 po Q6 hrs prn. May continue with PRN Hydrocodone. Continue with Tamsulosin 0.4 mg 1 po daily. F/U 1 week if pain persist or worsens will need CT urogram. Instructed to contact office if he has any acute changes ie temp >100.5, intractable pain, or vomiting, - 07/04/2017 History of urolithiasis - 02/04/2018 Renal and ureteral calculus - 11/22/2017 Renal calculus, Nephrolithiasis - 2017 Primary hypogonadism, Hypogonadism, testicular - 2016 Gross hematuria, Gross hematuria - 2014 Renal colic, Renal colic - 2014 Ureteral calculus, Calculus of ureter - 2014      PMH Notes:  Past urologic history:   [name] has a history of nephrolithiasis. He has had over 20 stone events. He usually passes his stones spontaneously, but he has required lithotripsy on two occasions. CT in June 2012 showed a 2 mm left distal ureteral stone which he eventually passed and many non obstructing stones bilaterally, all measuring around 1-3 mm.    He was diagnosed with "low testosterone" by his PCP. He had a lack of motivation and low libido. He tried Androgel, but was switched to Testim by his insurance company. He had his labs drawn after using the gel (1 pack daily) and states his testosterone was still low but in the normal range. His PCP has increased his dose to 2 packs  per day for 1 month to see if this increases his levels further. He does notice a slight increase in motivation and has an increase in libido at night after he uses the product. His main concern is that the Testim is quite sticky. 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 Irritable bowel syndrome with diarrhea (Stable) - 12/10/2017, Irritable Bowel Syndrome, -  2014 Encounter for general adult medical examination without abnormal findings, Encounter for preventive health examination - 2017 Psychogenic ED, Erectile disorder, acquired, generalized, moderate - 2017 Personal history of other diseases of the nervous system and sense organs, History of migraine headaches - 2015 Asthma, Asthma - 2014 Gastric ulcer, unspecified as acute or chronic, without hemorrhage or perforation, Gastric Ulcer - 2014 Personal history of other diseases of the digestive system, History of esophageal reflux - 2014 Personal history of other endocrine, nutritional and metabolic disease, History of hyperlipidemia - 2014 Hypertension    FAMILY HISTORY: Diabetes - Runs In Family Family Health Status Number - Runs In Family Urologic Disorder - Mother, Brother, Runs In Family   SOCIAL HISTORY: Marital Status: Married Preferred Language: English; Ethnicity: Not Hispanic Or Latino; Race: White Current Smoking Status: Patient has never smoked.  Does not use smokeless tobacco. Drinks 2 drinks per year. Social Drinker.  Does not use drugs. Does not drink caffeine.    REVIEW OF SYSTEMS:    GU Review Male:   Patient reports frequent urination and get up at night to urinate. Patient denies hard to postpone urination, burning/ pain with urination, leakage of urine, stream starts and stops, trouble starting your stream, have to strain to urinate , erection problems, and penile pain.  Gastrointestinal (Upper):   Patient denies nausea, indigestion/ heartburn, and vomiting.  Gastrointestinal (Lower):   Patient denies diarrhea and constipation.  Constitutional:   Patient denies fever, night sweats, weight loss, and fatigue.  Skin:   Patient denies skin rash/ lesion and itching.  Eyes:   Patient denies blurred vision and double vision.  Ears/ Nose/ Throat:   Patient denies sore throat and sinus problems.  Hematologic/Lymphatic:   Patient denies swollen glands and easy bruising.   Cardiovascular:   Patient denies leg swelling and chest pains.  Respiratory:   Patient denies cough and shortness of breath.  Endocrine:   Patient denies excessive thirst.  Musculoskeletal:   Patient denies back pain and joint pain.  Neurological:   Patient denies headaches and dizziness.  Psychologic:   Patient denies depression and anxiety.   VITAL SIGNS:    Weight 183 lb / 83.01 kg  Height 70 in / 177.8 cm  BP 144/85 mmHg  Heart Rate 66 /min  Temperature 97.4 F / 36.3 C  BMI 26.3 kg/m   MULTI-SYSTEM PHYSICAL EXAMINATION:    Constitutional: Well-nourished. No physical deformities. Normally developed. Good grooming.  Neck: Neck symmetrical, not swollen. Normal tracheal position.  Respiratory: No labored breathing, no use of accessory muscles.   Cardiovascular: Normal temperature, normal extremity pulses, no swelling, no varicosities.  Lymphatic: No enlargement of neck, axillae, groin.  Skin: No paleness, no jaundice, no cyanosis. No lesion, no ulcer, no rash.  Neurologic / Psychiatric: Oriented to time, oriented to place, oriented to person. No depression, no anxiety, no agitation.  Gastrointestinal: No mass, no tenderness, no rigidity, non obese abdomen.  Eyes: Normal conjunctivae. Normal eyelids.  Ears, Nose, Mouth, and Throat: Left ear no scars, no lesions,  no masses. Right ear no scars, no lesions, no masses. Nose no scars, no lesions, no masses. Normal hearing. Normal lips.  Musculoskeletal: Normal gait and station of head and neck.     PAST DATA REVIEWED:  Source Of History:  Patient   09/19/17 02/19/17 12/28/15 11/18/14 11/06/13  PSA  Total PSA 0.88 ng/mL 1.16 ng/dl 4.46  2.86  3.81     77/11/65 06/21/18 05/03/18 03/22/18 12/21/17 09/28/17 09/19/17 02/19/17  Hormones  Testosterone, Total 1182.5 ng/dL 790.3 ng/dL 833.3 ng/dL 8329.1 ng/dL 916.6 ng/dL 060.0 ng/dL 45.9 ng/dL 977.4 pg/dL    PROCEDURES:         KUB - [id number]  A single view of the abdomen is obtained.       There are upper and lower pole calcifications within the left renal shadow. Each measured proximally 5 mm in greatest dimension. The lower pole calcification was present on his KUB from June 2019. The right renal shadow enshrouded by bowel gas and is difficult to identify any calcifications within it. There are no calcifications along the expected course of either ureter. There are no abnormal bowel gas patterns or bony abnormalities. No calcifications seen within the bladder.         Urinalysis w/Scope Dipstick Dipstick Cont'd Micro  Color: Straw Bilirubin: Neg mg/dL WBC/hpf: NS (Not Seen)  Appearance: Clear Ketones: Neg mg/dL RBC/hpf: 0 - 2/hpf  Specific Gravity: <=1.005 Blood: 1+ ery/uL Bacteria: NS (Not Seen)  pH: <=5.0 Protein: Neg mg/dL Cystals: NS (Not Seen)  Glucose: Neg mg/dL Urobilinogen: 0.2 mg/dL Casts: NS (Not Seen)    Nitrites: Neg Trichomonas: Not Present    Leukocyte Esterase: Neg leu/uL Mucous: Not Present      Epithelial Cells: 0 - 5/hpf      Yeast: NS (Not Seen)      Sperm: Not Present    ASSESSMENT:      ICD-10 Details  1 GU:   Primary hypogonadism - E29.1   2   History of urolithiasis - Z87.442   3   BPH w/LUTS - N40.1   4   Urinary Frequency - R35.0   5 NON-GU:   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

## 2018-12-01 NOTE — Discharge Instructions (Signed)

## 2018-12-05 ENCOUNTER — Ambulatory Visit (HOSPITAL_COMMUNITY)
Admission: RE | Admit: 2018-12-05 | Discharge: 2018-12-05 | Disposition: A | Discharge status: Home / Self Care | Payer: BLUE CROSS/BLUE SHIELD | Source: Ambulatory Visit | Attending: Urology | Admitting: Urology

## 2018-12-05 ENCOUNTER — Ambulatory Visit (HOSPITAL_COMMUNITY): Payer: BLUE CROSS/BLUE SHIELD

## 2018-12-05 ENCOUNTER — Encounter (HOSPITAL_COMMUNITY)
Admission: RE | Disposition: A | Discharge status: Home / Self Care | Payer: Self-pay | Source: Ambulatory Visit | Attending: Urology

## 2018-12-05 ENCOUNTER — Encounter (HOSPITAL_COMMUNITY): Payer: Self-pay | Admitting: General Practice

## 2018-12-05 DIAGNOSIS — Z87442 Personal history of urinary calculi: Secondary | ICD-10-CM | POA: Insufficient documentation

## 2018-12-05 DIAGNOSIS — N401 Enlarged prostate with lower urinary tract symptoms: Secondary | ICD-10-CM | POA: Diagnosis not present

## 2018-12-05 DIAGNOSIS — E291 Testicular hypofunction: Secondary | ICD-10-CM | POA: Diagnosis not present

## 2018-12-05 DIAGNOSIS — I10 Essential (primary) hypertension: Secondary | ICD-10-CM | POA: Insufficient documentation

## 2018-12-05 DIAGNOSIS — Z79899 Other long term (current) drug therapy: Secondary | ICD-10-CM | POA: Diagnosis not present

## 2018-12-05 DIAGNOSIS — Z791 Long term (current) use of non-steroidal anti-inflammatories (NSAID): Secondary | ICD-10-CM | POA: Diagnosis not present

## 2018-12-05 DIAGNOSIS — R350 Frequency of micturition: Secondary | ICD-10-CM | POA: Insufficient documentation

## 2018-12-05 DIAGNOSIS — K580 Irritable bowel syndrome with diarrhea: Secondary | ICD-10-CM | POA: Insufficient documentation

## 2018-12-05 DIAGNOSIS — N200 Calculus of kidney: Secondary | ICD-10-CM | POA: Insufficient documentation

## 2018-12-05 DIAGNOSIS — R82991 Hypocitraturia: Secondary | ICD-10-CM | POA: Diagnosis not present

## 2018-12-05 DIAGNOSIS — Z79811 Long term (current) use of aromatase inhibitors: Secondary | ICD-10-CM | POA: Insufficient documentation

## 2018-12-05 HISTORY — PX: EXTRACORPOREAL SHOCK WAVE LITHOTRIPSY: SHX1557

## 2018-12-05 SURGERY — LITHOTRIPSY, ESWL
Anesthesia: LOCAL | Laterality: Left

## 2018-12-05 MED ORDER — CIPROFLOXACIN HCL 500 MG PO TABS
500.0000 mg | ORAL_TABLET | ORAL | Status: AC
  Administered 2018-12-05: 500 mg via ORAL
  Filled 2018-12-05: qty 1

## 2018-12-05 MED ORDER — DIAZEPAM 5 MG PO TABS
10.0000 mg | ORAL_TABLET | ORAL | Status: AC
  Administered 2018-12-05: 10 mg via ORAL
  Filled 2018-12-05: qty 2

## 2018-12-05 MED ORDER — HYDROCODONE-ACETAMINOPHEN 10-325 MG PO TABS: 1.0000 | ORAL_TABLET | ORAL | 0 refills | Status: AC | PRN

## 2018-12-05 MED ORDER — DIPHENHYDRAMINE HCL 25 MG PO CAPS
25.0000 mg | ORAL_CAPSULE | ORAL | Status: AC
  Administered 2018-12-05: 25 mg via ORAL
  Filled 2018-12-05: qty 1

## 2018-12-05 MED ORDER — HYDROCODONE-ACETAMINOPHEN 5-325 MG PO TABS
2.0000 | ORAL_TABLET | Freq: Once | ORAL | Status: AC
  Administered 2018-12-05: 2 via ORAL

## 2018-12-05 MED ORDER — SODIUM CHLORIDE 0.9 % IV SOLN
INTRAVENOUS | Status: DC
  Administered 2018-12-05: 07:00:00 via INTRAVENOUS

## 2018-12-05 MED ORDER — TAMSULOSIN HCL 0.4 MG PO CAPS: 0.4000 mg | ORAL_CAPSULE | ORAL | 0 refills | Status: AC

## 2018-12-05 MED ORDER — HYDROCODONE-ACETAMINOPHEN 5-325 MG PO TABS
ORAL_TABLET | ORAL | Status: AC
  Filled 2018-12-05: qty 2

## 2018-12-05 NOTE — Op Note (Signed)
See [facility] OP note scanned into chart. Also because of the size, density, location and other factors that cannot be anticipated I feel this will likely be a staged procedure. This fact supersedes any indication in the scanned [facility] operative note to the contrary.  

## 2018-12-06 ENCOUNTER — Encounter (HOSPITAL_COMMUNITY): Payer: Self-pay | Admitting: Urology

## 2019-06-24 ENCOUNTER — Other Ambulatory Visit: Payer: Self-pay

## 2019-06-24 ENCOUNTER — Ambulatory Visit: Payer: BC Managed Care – PPO | Admitting: Orthopaedic Surgery

## 2019-06-24 ENCOUNTER — Encounter: Payer: Self-pay | Admitting: Orthopaedic Surgery

## 2019-06-24 DIAGNOSIS — G8929 Other chronic pain: Secondary | ICD-10-CM | POA: Diagnosis not present

## 2019-06-24 DIAGNOSIS — M545 Low back pain, unspecified: Secondary | ICD-10-CM | POA: Insufficient documentation

## 2019-06-24 DIAGNOSIS — M25512 Pain in left shoulder: Secondary | ICD-10-CM | POA: Diagnosis not present

## 2019-06-24 MED ORDER — BUPIVACAINE HCL 0.5 % IJ SOLN
2.0000 mL | INTRAMUSCULAR | Status: AC | PRN
  Administered 2019-06-24: 2 mL via INTRA_ARTICULAR

## 2019-06-24 MED ORDER — LIDOCAINE HCL 2 % IJ SOLN
2.0000 mL | INTRAMUSCULAR | Status: AC | PRN
  Administered 2019-06-24: 2 mL

## 2019-06-24 MED ORDER — METHYLPREDNISOLONE ACETATE 40 MG/ML IJ SUSP
80.0000 mg | INTRAMUSCULAR | Status: AC | PRN
  Administered 2019-06-24: 80 mg via INTRA_ARTICULAR

## 2019-06-24 NOTE — Progress Notes (Signed)
Office Visit Note   Patient: Gary Fitzgerald           Date of Birth: 18-Jan-1966           MRN: [id number] Visit Date: 06/24/2019              Requested by: Ronnald Collumuran, Michael R, PA-C [facility]  [address] [place] [id number] PCP: Coralee Ruduran, Michael R, PA-C   Assessment & Plan: Visit Diagnoses:  1. Chronic left shoulder pain   2. Chronic bilateral low back pain without sciatica     Plan: Recurrent low back pain without radicular symptoms.  Went to urgent care and was prescribed muscle relaxant and anti-inflammatory medicine.  Feeling better but still having some compromise of activity.  Will order MRI scan.  Gary Fitzgerald does not appear to have radicular pain but I suspect his problem is at L5-S1.  He may be a candidate for facet injections pending results of the MRI scan. no change in medicines  Also having a recurrent subacromial left shoulder pain.  Excellent response to cortisone injection in December 2019.  Symptoms are consistent with recurrent impingement.  Will reinject and monitor response  Follow-Up Instructions: Return After MRI scan lumbar spine.   Orders:  Orders Placed This Encounter  Procedures  . Large Joint Inj: L subacromial bursa   No orders of the defined types were placed in this encounter.     Procedures: Large Joint Inj: L subacromial bursa on 06/24/2019 4:08 PM Indications: pain and diagnostic evaluation Details: 25 G 1.5 in needle, anterolateral approach  Arthrogram: No  Medications: 2 mL lidocaine 2 %; 2 mL bupivacaine 0.5 %; 80 mg methylPREDNISolone acetate 40 MG/ML Consent was given by the patient. Immediately prior to procedure a time out was called to verify the correct patient, procedure, equipment, support staff and site/side marked as required. Patient was prepped and draped in the usual sterile fashion.       Clinical Data: No additional findings.   Subjective: Chief Complaint  Patient presents with  . Lower Back -  Follow-up  Patient presents today for follow up on his lower back. He was last evaluated 9 months ago. Patient states that last week he was doing yard work and had a "typical back ache" while doing so, but later that night his wife had to help him get off bed due to pain. He went to urgent care and was given an injection of Toradol, muscle relaxers, and antiinflammatories. His pain is much improved, but still having problems with getting out the bed and bendning over to tie his shoes.  No related buttock or lower extremity pain.  No numbness or tingling.  On a scale of 1-10  present pain is only a 3.  He did have similar pain in December 2019 with x-rays demonstrating some degenerative changes at L5-S1.  Had subacromial cortisone injection left shoulder in December with excellent response.  Having some recurrent symptoms with increased activity  HPI  Review of Systems   Objective: Vital Signs: BP 132/81   Pulse 64   Ht 5\' 5"  (1.651 m)   Wt 190 lb (86.2 kg)   BMI 31.62 kg/m   Physical Exam  Ortho Exam left shoulder with positive impingement.  Biceps intact.  Skin intact.  Some anterior and lateral subacromial pain.  No crepitation.  Able to place her arm over his head with somewhat of a circuitous arc of motion.  Minimally positive empty can testing.  .Marland Kitchen  Good grip and release.  Specialty Comments:  No specialty comments available.  Imaging: No results found.   PMFS History: Patient Active Problem List   Diagnosis Date Noted  . Pain in left shoulder 06/24/2019  . Low back pain 06/24/2019  . Classic migraine with aura 11/11/2013  . Dizziness and giddiness 11/11/2013  . Coital headache 11/11/2013   Past Medical History:  Diagnosis Date  . Classic migraine with aura 11/11/2013  . 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 Dizziness   . Headache(784.0)   . Hearing loss    asymmetrical  . History of kidney stones   . Hypertension   . Irritable bowel syndrome   . Renal calculi     Lithotripsy  . Testosterone deficiency     Family History  Problem Relation Age of Onset  . High blood pressure Mother   . Hypertension Mother   . High blood pressure Father   . Cancer - Prostate Father   . Skin cancer Father   . Hypertension Father   . Hypertension Brother   . Diabetes Brother     Past Surgical History:  Procedure Laterality Date  . ANTERIOR CRUCIATE LIGAMENT REPAIR     x2  . EXTRACORPOREAL SHOCK WAVE LITHOTRIPSY Left 03/05/2017   Procedure: LEFT EXTRACORPOREAL SHOCK WAVE LITHOTRIPSY (ESWL);  Surgeon: Festus Aloe, MD;  Location: [facility];  Service: Urology;  Laterality: Left;  . EXTRACORPOREAL SHOCK WAVE LITHOTRIPSY Right 11/26/2017   Procedure: RIGHT EXTRACORPOREAL SHOCK WAVE LITHOTRIPSY (ESWL);  Surgeon: Ceasar Mons, MD;  Location: [facility];  Service: Urology;  Laterality: Right;  . EXTRACORPOREAL SHOCK WAVE LITHOTRIPSY Left 12/05/2018   Procedure: EXTRACORPOREAL SHOCK WAVE LITHOTRIPSY (ESWL);  Surgeon: Kathie Rhodes, MD;  Location: [facility];  Service: Urology;  Laterality: Left;  . EYE SURGERY     lasik surgery bilat   . HIP SURGERY Right   . KNEE SURGERY Left    x2 left knee  . SHOULDER SURGERY     Bilateral  . Thumb surgery     Tendon repair, right   Social History   Occupational History    Comment: [employer]  Tobacco Use  . Smoking status: Never Smoker  . Smokeless tobacco: Never Used  Substance and Sexual Activity  . Alcohol use: No  . Drug use: Not Currently  . Sexual activity: Not on file

## 2019-06-24 NOTE — Addendum Note (Signed)
Addended by: Lendon Collar on: 06/24/2019 04:31 PM   Modules accepted: Orders

## 2019-07-15 ENCOUNTER — Ambulatory Visit: Payer: BC Managed Care – PPO | Admitting: Orthopaedic Surgery

## 2019-07-24 ENCOUNTER — Ambulatory Visit
Admission: RE | Admit: 2019-07-24 | Discharge: 2019-07-24 | Disposition: A | Discharge status: Home / Self Care | Payer: BC Managed Care – PPO | Source: Ambulatory Visit | Attending: Orthopaedic Surgery | Admitting: Orthopaedic Surgery

## 2019-07-24 DIAGNOSIS — G8929 Other chronic pain: Secondary | ICD-10-CM

## 2019-07-31 ENCOUNTER — Ambulatory Visit: Payer: BC Managed Care – PPO | Admitting: Orthopaedic Surgery

## 2019-07-31 ENCOUNTER — Other Ambulatory Visit: Payer: Self-pay

## 2019-07-31 ENCOUNTER — Encounter: Payer: Self-pay | Admitting: Orthopaedic Surgery

## 2019-07-31 VITALS — BP 128/79 | HR 64 | Ht 65.0 in | Wt 190.0 lb

## 2019-07-31 DIAGNOSIS — M545 Low back pain, unspecified: Secondary | ICD-10-CM

## 2019-07-31 DIAGNOSIS — G8929 Other chronic pain: Secondary | ICD-10-CM

## 2019-07-31 NOTE — Progress Notes (Signed)
Office Visit Note   Patient: Gary Fitzgerald           Date of Birth: [birth year]           MRN: [id number] Visit Date: 07/31/2019              Requested by: Ronnald Collum [facility]  [address] [place],  [place] [address] PCP: Coralee Rud, PA-C   Assessment & Plan: Visit Diagnoses:  1. Chronic bilateral low back pain without sciatica     Plan: MRI scan reveals mild facet arthropathy throughout the lumbar spine without compression centrally or peripherally.  Long discussion with Trey Paula regarding the scan.  Given a copy of the scan.  I think a course of physical therapy would be helpful.  We have also discussed over-the-counter medicine.  He is not experiencing any radicular pain. We will plan to see back as needed Follow-Up Instructions: Return if symptoms worsen or fail to improve.   Orders:  No orders of the defined types were placed in this encounter.  No orders of the defined types were placed in this encounter.     Procedures: No procedures performed   Clinical Data: No additional findings.   Subjective: Chief Complaint  Patient presents with  . Lower Back - Follow-up  Patient presents today for a one month follow up. He had an MRI of his lower back on 07/24/2019. Patient states that his back is doing "alright" today. He does not take anything for pain.  No related radicular pain to either lower extremity.  Does use over-the-counter medicines as needed  HPI  Review of Systems  Constitutional: Negative for fatigue.  HENT: Negative for ear pain.   Eyes: Negative for pain.  Respiratory: Negative for shortness of breath.   Cardiovascular: Negative for leg swelling.  Gastrointestinal: Negative for constipation and diarrhea.  Endocrine: Negative for cold intolerance and heat intolerance.  Genitourinary: Positive for difficulty urinating.  Musculoskeletal: Negative for joint swelling.  Skin: Negative for rash.  Allergic/Immunologic:  Positive for food allergies.  Neurological: Negative for weakness.  Hematological: Does not bruise/bleed easily.  Psychiatric/Behavioral: Negative for sleep disturbance.     Objective: Vital Signs: BP 128/79   Pulse 64   Ht 5\' 5"  (1.651 m)   Wt 190 lb (86.2 kg)   BMI 31.62 kg/m   Physical Exam Constitutional:      Appearance: He is well-developed.  Eyes:     Pupils: Pupils are equal, round, and reactive to light.  Pulmonary:     Effort: Pulmonary effort is normal.  Skin:    General: Skin is warm and dry.  Neurological:     Mental Status: He is alert and oriented to person, place, and time.  Psychiatric:        Behavior: Behavior normal.     Ortho Exam awake alert and oriented x3.  Comfortable standing and walking.  No limp.  Painless range of motion both hips.  Motor exam intact.  Very minimal percussible tenderness at L5-S1.  No SI joint pain.  Pelvis is level.  Hamstrings might be a little tight  Specialty Comments:  No specialty comments available.  Imaging: No results found.   PMFS History: Patient Active Problem List   Diagnosis Date Noted  . Pain in left shoulder 06/24/2019  . Low back pain 06/24/2019  . Classic migraine with aura 11/11/2013  . Dizziness and giddiness 11/11/2013  . Coital headache 11/11/2013   Past Medical  History:  Diagnosis Date  . Classic migraine with aura 11/11/2013  . 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 Dizziness   . Headache(784.0)   . Hearing loss    asymmetrical  . History of kidney stones   . Hypertension   . Irritable bowel syndrome   . Renal calculi    Lithotripsy  . Testosterone deficiency     Family History  Problem Relation Age of Onset  . High blood pressure Mother   . Hypertension Mother   . High blood pressure Father   . Cancer - Prostate Father   . Skin cancer Father   . Hypertension Father   . Hypertension Brother   . Diabetes Brother     Past Surgical History:  Procedure Laterality Date  . ANTERIOR  CRUCIATE LIGAMENT REPAIR     x2  . EXTRACORPOREAL SHOCK WAVE LITHOTRIPSY Left 03/05/2017   Procedure: LEFT EXTRACORPOREAL SHOCK WAVE LITHOTRIPSY (ESWL);  Surgeon: Festus Aloe, MD;  Location: [facility];  Service: Urology;  Laterality: Left;  . EXTRACORPOREAL SHOCK WAVE LITHOTRIPSY Right 11/26/2017   Procedure: RIGHT EXTRACORPOREAL SHOCK WAVE LITHOTRIPSY (ESWL);  Surgeon: Ceasar Mons, MD;  Location: [facility];  Service: Urology;  Laterality: Right;  . EXTRACORPOREAL SHOCK WAVE LITHOTRIPSY Left 12/05/2018   Procedure: EXTRACORPOREAL SHOCK WAVE LITHOTRIPSY (ESWL);  Surgeon: Kathie Rhodes, MD;  Location: [facility];  Service: Urology;  Laterality: Left;  . EYE SURGERY     lasik surgery bilat   . HIP SURGERY Right   . KNEE SURGERY Left    x2 left knee  . SHOULDER SURGERY     Bilateral  . Thumb surgery     Tendon repair, right   Social History   Occupational History    Comment: [employer]  Tobacco Use  . Smoking status: Never Smoker  . Smokeless tobacco: Never Used  Substance and Sexual Activity  . Alcohol use: No  . Drug use: Not Currently  . Sexual activity: Not on file

## 2019-07-31 NOTE — Addendum Note (Signed)
Addended by: Lendon Collar on: 07/31/2019 03:20 PM   Modules accepted: Orders

## 2019-08-06 ENCOUNTER — Ambulatory Visit: Payer: BC Managed Care – PPO | Admitting: Orthopaedic Surgery

## 2019-10-07 ENCOUNTER — Ambulatory Visit: Payer: BC Managed Care – PPO | Admitting: Orthopaedic Surgery

## 2019-10-08 ENCOUNTER — Other Ambulatory Visit: Payer: Self-pay

## 2019-10-08 ENCOUNTER — Encounter (HOSPITAL_BASED_OUTPATIENT_CLINIC_OR_DEPARTMENT_OTHER): Payer: Self-pay | Admitting: Orthopedic Surgery

## 2019-10-10 ENCOUNTER — Other Ambulatory Visit (HOSPITAL_COMMUNITY)
Admission: RE | Admit: 2019-10-10 | Discharge: 2019-10-10 | Disposition: A | Discharge status: Home / Self Care | Payer: BC Managed Care – PPO | Source: Ambulatory Visit | Attending: Orthopedic Surgery | Admitting: Orthopedic Surgery

## 2019-10-10 ENCOUNTER — Other Ambulatory Visit: Payer: Self-pay

## 2019-10-10 ENCOUNTER — Encounter (HOSPITAL_BASED_OUTPATIENT_CLINIC_OR_DEPARTMENT_OTHER)
Admission: RE | Admit: 2019-10-10 | Discharge: 2019-10-10 | Disposition: A | Discharge status: Home / Self Care | Payer: BC Managed Care – PPO | Source: Ambulatory Visit | Attending: Orthopedic Surgery | Admitting: Orthopedic Surgery

## 2019-10-10 ENCOUNTER — Other Ambulatory Visit: Payer: Self-pay | Admitting: Orthopedic Surgery

## 2019-10-10 DIAGNOSIS — Z808 Family history of malignant neoplasm of other organs or systems: Secondary | ICD-10-CM | POA: Diagnosis not present

## 2019-10-10 DIAGNOSIS — Z01812 Encounter for preprocedural laboratory examination: Secondary | ICD-10-CM | POA: Diagnosis not present

## 2019-10-10 DIAGNOSIS — W268XXA Contact with other sharp object(s), not elsewhere classified, initial encounter: Secondary | ICD-10-CM | POA: Diagnosis not present

## 2019-10-10 DIAGNOSIS — Z20828 Contact with and (suspected) exposure to other viral communicable diseases: Secondary | ICD-10-CM | POA: Insufficient documentation

## 2019-10-10 DIAGNOSIS — G43909 Migraine, unspecified, not intractable, without status migrainosus: Secondary | ICD-10-CM | POA: Diagnosis not present

## 2019-10-10 DIAGNOSIS — Z87442 Personal history of urinary calculi: Secondary | ICD-10-CM | POA: Diagnosis not present

## 2019-10-10 DIAGNOSIS — S66221A Laceration of extensor muscle, fascia and tendon of right thumb at wrist and hand level, initial encounter: Secondary | ICD-10-CM | POA: Diagnosis present

## 2019-10-10 DIAGNOSIS — Z8042 Family history of malignant neoplasm of prostate: Secondary | ICD-10-CM | POA: Diagnosis not present

## 2019-10-10 DIAGNOSIS — Z791 Long term (current) use of non-steroidal anti-inflammatories (NSAID): Secondary | ICD-10-CM | POA: Diagnosis not present

## 2019-10-10 DIAGNOSIS — I10 Essential (primary) hypertension: Secondary | ICD-10-CM | POA: Diagnosis not present

## 2019-10-10 DIAGNOSIS — Z79899 Other long term (current) drug therapy: Secondary | ICD-10-CM | POA: Diagnosis not present

## 2019-10-10 DIAGNOSIS — Z885 Allergy status to narcotic agent status: Secondary | ICD-10-CM | POA: Diagnosis not present

## 2019-10-10 DIAGNOSIS — Z8249 Family history of ischemic heart disease and other diseases of the circulatory system: Secondary | ICD-10-CM | POA: Diagnosis not present

## 2019-10-10 DIAGNOSIS — Z833 Family history of diabetes mellitus: Secondary | ICD-10-CM | POA: Diagnosis not present

## 2019-10-10 DIAGNOSIS — K589 Irritable bowel syndrome without diarrhea: Secondary | ICD-10-CM | POA: Diagnosis not present

## 2019-10-10 NOTE — Progress Notes (Signed)

## 2019-10-11 LAB — NOVEL CORONAVIRUS, NAA (HOSP ORDER, SEND-OUT TO REF LAB; TAT 18-24 HRS): SARS-CoV-2, NAA: NOT DETECTED

## 2019-10-14 ENCOUNTER — Ambulatory Visit (HOSPITAL_BASED_OUTPATIENT_CLINIC_OR_DEPARTMENT_OTHER): Payer: BC Managed Care – PPO | Admitting: Anesthesiology

## 2019-10-14 ENCOUNTER — Ambulatory Visit (HOSPITAL_BASED_OUTPATIENT_CLINIC_OR_DEPARTMENT_OTHER)
Admission: RE | Admit: 2019-10-14 | Discharge: 2019-10-14 | Disposition: A | Discharge status: Home / Self Care | Payer: BC Managed Care – PPO | Attending: Orthopedic Surgery | Admitting: Orthopedic Surgery

## 2019-10-14 ENCOUNTER — Other Ambulatory Visit: Payer: Self-pay

## 2019-10-14 ENCOUNTER — Encounter (HOSPITAL_BASED_OUTPATIENT_CLINIC_OR_DEPARTMENT_OTHER)
Admission: RE | Disposition: A | Discharge status: Home / Self Care | Payer: Self-pay | Source: Home / Self Care | Attending: Orthopedic Surgery

## 2019-10-14 ENCOUNTER — Encounter (HOSPITAL_BASED_OUTPATIENT_CLINIC_OR_DEPARTMENT_OTHER): Payer: Self-pay | Admitting: Orthopedic Surgery

## 2019-10-14 DIAGNOSIS — Z791 Long term (current) use of non-steroidal anti-inflammatories (NSAID): Secondary | ICD-10-CM | POA: Insufficient documentation

## 2019-10-14 DIAGNOSIS — G43909 Migraine, unspecified, not intractable, without status migrainosus: Secondary | ICD-10-CM | POA: Insufficient documentation

## 2019-10-14 DIAGNOSIS — S66221A Laceration of extensor muscle, fascia and tendon of right thumb at wrist and hand level, initial encounter: Secondary | ICD-10-CM | POA: Insufficient documentation

## 2019-10-14 DIAGNOSIS — I10 Essential (primary) hypertension: Secondary | ICD-10-CM | POA: Insufficient documentation

## 2019-10-14 DIAGNOSIS — Z833 Family history of diabetes mellitus: Secondary | ICD-10-CM | POA: Insufficient documentation

## 2019-10-14 DIAGNOSIS — Z8249 Family history of ischemic heart disease and other diseases of the circulatory system: Secondary | ICD-10-CM | POA: Insufficient documentation

## 2019-10-14 DIAGNOSIS — K589 Irritable bowel syndrome without diarrhea: Secondary | ICD-10-CM | POA: Insufficient documentation

## 2019-10-14 DIAGNOSIS — W268XXA Contact with other sharp object(s), not elsewhere classified, initial encounter: Secondary | ICD-10-CM | POA: Insufficient documentation

## 2019-10-14 DIAGNOSIS — Z79899 Other long term (current) drug therapy: Secondary | ICD-10-CM | POA: Insufficient documentation

## 2019-10-14 DIAGNOSIS — Z885 Allergy status to narcotic agent status: Secondary | ICD-10-CM | POA: Insufficient documentation

## 2019-10-14 DIAGNOSIS — Z808 Family history of malignant neoplasm of other organs or systems: Secondary | ICD-10-CM | POA: Insufficient documentation

## 2019-10-14 DIAGNOSIS — Z8042 Family history of malignant neoplasm of prostate: Secondary | ICD-10-CM | POA: Insufficient documentation

## 2019-10-14 DIAGNOSIS — Z87442 Personal history of urinary calculi: Secondary | ICD-10-CM | POA: Insufficient documentation

## 2019-10-14 HISTORY — DX: Other specified postprocedural states: R11.2

## 2019-10-14 HISTORY — PX: NERVE, TENDON AND ARTERY REPAIR: SHX5695

## 2019-10-14 SURGERY — NERVE, TENDON AND ARTERY REPAIR
Anesthesia: Monitor Anesthesia Care | Site: Thumb | Laterality: Right

## 2019-10-14 MED ORDER — VANCOMYCIN HCL IN DEXTROSE 1-5 GM/200ML-% IV SOLN
1000.0000 mg | INTRAVENOUS | Status: AC
  Administered 2019-10-14: 1000 mg via INTRAVENOUS

## 2019-10-14 MED ORDER — HEPARIN SODIUM (PORCINE) 1000 UNIT/ML IJ SOLN
INTRAMUSCULAR | Status: AC
  Filled 2019-10-14: qty 1

## 2019-10-14 MED ORDER — ONDANSETRON HCL 4 MG/2ML IJ SOLN
INTRAMUSCULAR | Status: DC | PRN
  Administered 2019-10-14: 4 mg via INTRAVENOUS

## 2019-10-14 MED ORDER — HYDROCODONE-ACETAMINOPHEN 5-325 MG PO TABS: ORAL_TABLET | ORAL | 0 refills | Status: AC

## 2019-10-14 MED ORDER — MEPERIDINE HCL 25 MG/ML IJ SOLN: 6.2500 mg | INTRAMUSCULAR | Status: DC | PRN

## 2019-10-14 MED ORDER — BUPIVACAINE-EPINEPHRINE (PF) 0.5% -1:200000 IJ SOLN
INTRAMUSCULAR | Status: DC | PRN
  Administered 2019-10-14: 30 mL via PERINEURAL

## 2019-10-14 MED ORDER — PROPOFOL 10 MG/ML IV BOLUS
INTRAVENOUS | Status: DC | PRN
  Administered 2019-10-14: 40 mg via INTRAVENOUS

## 2019-10-14 MED ORDER — MIDAZOLAM HCL 2 MG/2ML IJ SOLN
1.0000 mg | INTRAMUSCULAR | Status: DC | PRN
  Administered 2019-10-14: 13:00:00 2 mg via INTRAVENOUS

## 2019-10-14 MED ORDER — LIDOCAINE HCL (PF) 1 % IJ SOLN
INTRAMUSCULAR | Status: AC
  Filled 2019-10-14: qty 30

## 2019-10-14 MED ORDER — FENTANYL CITRATE (PF) 100 MCG/2ML IJ SOLN
100.0000 ug | INTRAMUSCULAR | Status: DC | PRN
  Administered 2019-10-14: 100 ug via INTRAVENOUS

## 2019-10-14 MED ORDER — PROPOFOL 500 MG/50ML IV EMUL
INTRAVENOUS | Status: DC | PRN
  Administered 2019-10-14: 100 ug/kg/min via INTRAVENOUS

## 2019-10-14 MED ORDER — CHLORHEXIDINE GLUCONATE 4 % EX LIQD: 60.0000 mL | Freq: Once | CUTANEOUS | Status: DC

## 2019-10-14 MED ORDER — LACTATED RINGERS IV SOLN: INTRAVENOUS | Status: DC

## 2019-10-14 MED ORDER — VANCOMYCIN HCL IN DEXTROSE 1-5 GM/200ML-% IV SOLN
INTRAVENOUS | Status: AC
  Filled 2019-10-14: qty 200

## 2019-10-14 MED ORDER — MIDAZOLAM HCL 2 MG/2ML IJ SOLN
INTRAMUSCULAR | Status: AC
  Filled 2019-10-14: qty 2

## 2019-10-14 MED ORDER — ONDANSETRON HCL 4 MG/2ML IJ SOLN: 4.0000 mg | Freq: Once | INTRAMUSCULAR | Status: DC | PRN

## 2019-10-14 MED ORDER — FENTANYL CITRATE (PF) 100 MCG/2ML IJ SOLN
INTRAMUSCULAR | Status: AC
  Filled 2019-10-14: qty 2

## 2019-10-14 MED ORDER — HYDROMORPHONE HCL 1 MG/ML IJ SOLN: 0.2500 mg | INTRAMUSCULAR | Status: DC | PRN

## 2019-10-14 MED ORDER — BUPIVACAINE HCL (PF) 0.25 % IJ SOLN
INTRAMUSCULAR | Status: AC
  Filled 2019-10-14: qty 30

## 2019-10-14 SURGICAL SUPPLY — 64 items
APL PRP STRL LF DISP 70% ISPRP (MISCELLANEOUS) ×1
BAG DECANTER FOR FLEXI CONT (MISCELLANEOUS) IMPLANT
BLADE MINI RND TIP GREEN BEAV (BLADE) IMPLANT
BLADE SURG 15 STRL LF DISP TIS (BLADE) ×2 IMPLANT
BLADE SURG 15 STRL SS (BLADE) ×4
BNDG CMPR 9X4 STRL LF SNTH (GAUZE/BANDAGES/DRESSINGS)
BNDG ELASTIC 3X5.8 VLCR STR LF (GAUZE/BANDAGES/DRESSINGS) ×2 IMPLANT
BNDG ESMARK 4X9 LF (GAUZE/BANDAGES/DRESSINGS) IMPLANT
BNDG GAUZE ELAST 4 BULKY (GAUZE/BANDAGES/DRESSINGS) ×2 IMPLANT
BRUSH SCRUB EZ PLAIN DRY (MISCELLANEOUS) ×2 IMPLANT
CATH ROBINSON RED A/P 10FR (CATHETERS) IMPLANT
CHLORAPREP W/TINT 26 (MISCELLANEOUS) ×2 IMPLANT
CORD BIPOLAR FORCEPS 12FT (ELECTRODE) ×2 IMPLANT
COVER BACK TABLE REUSABLE LG (DRAPES) ×2 IMPLANT
COVER MAYO STAND REUSABLE (DRAPES) ×2 IMPLANT
COVER WAND RF STERILE (DRAPES) IMPLANT
CUFF TOURN SGL QUICK 18X4 (TOURNIQUET CUFF) IMPLANT
DECANTER SPIKE VIAL GLASS SM (MISCELLANEOUS) ×2 IMPLANT
DRAPE EXTREMITY T 121X128X90 (DISPOSABLE) ×2 IMPLANT
DRAPE SURG 17X23 STRL (DRAPES) ×2 IMPLANT
GAUZE SPONGE 4X4 12PLY STRL (GAUZE/BANDAGES/DRESSINGS) ×2 IMPLANT
GAUZE XEROFORM 1X8 LF (GAUZE/BANDAGES/DRESSINGS) ×2 IMPLANT
GLOVE BIO SURGEON STRL SZ7.5 (GLOVE) ×2 IMPLANT
GLOVE BIOGEL PI IND STRL 8 (GLOVE) ×1 IMPLANT
GLOVE BIOGEL PI IND STRL 8.5 (GLOVE) IMPLANT
GLOVE BIOGEL PI INDICATOR 8 (GLOVE) ×1
GLOVE BIOGEL PI INDICATOR 8.5 (GLOVE)
GLOVE SURG ORTHO 8.0 STRL STRW (GLOVE) IMPLANT
GOWN STRL REUS W/ TWL LRG LVL3 (GOWN DISPOSABLE) ×1 IMPLANT
GOWN STRL REUS W/TWL LRG LVL3 (GOWN DISPOSABLE) ×2
GOWN STRL REUS W/TWL XL LVL3 (GOWN DISPOSABLE) ×2 IMPLANT
LOOP VESSEL MAXI BLUE (MISCELLANEOUS) IMPLANT
NDL HYPO 25X1 1.5 SAFETY (NEEDLE) IMPLANT
NDL SAFETY ECLIPSE 18X1.5 (NEEDLE) IMPLANT
NEEDLE HYPO 18GX1.5 SHARP (NEEDLE)
NEEDLE HYPO 25X1 1.5 SAFETY (NEEDLE) IMPLANT
NS IRRIG 1000ML POUR BTL (IV SOLUTION) ×2 IMPLANT
PACK BASIN DAY SURGERY FS (CUSTOM PROCEDURE TRAY) ×2 IMPLANT
PAD CAST 3X4 CTTN HI CHSV (CAST SUPPLIES) ×1 IMPLANT
PAD CAST 4YDX4 CTTN HI CHSV (CAST SUPPLIES) IMPLANT
PADDING CAST ABS 4INX4YD NS (CAST SUPPLIES) ×1
PADDING CAST ABS COTTON 4X4 ST (CAST SUPPLIES) ×1 IMPLANT
PADDING CAST COTTON 3X4 STRL (CAST SUPPLIES) ×2
PADDING CAST COTTON 4X4 STRL (CAST SUPPLIES)
SLEEVE SCD COMPRESS KNEE MED (MISCELLANEOUS) IMPLANT
SPEAR EYE SURG WECK-CEL (MISCELLANEOUS) ×2 IMPLANT
SPLINT PLASTER CAST XFAST 3X15 (CAST SUPPLIES) IMPLANT
SPLINT PLASTER XTRA FASTSET 3X (CAST SUPPLIES)
STOCKINETTE 4X48 STRL (DRAPES) ×2 IMPLANT
SUT ETHIBOND 3-0 V-5 (SUTURE) IMPLANT
SUT ETHILON 4 0 PS 2 18 (SUTURE) ×2 IMPLANT
SUT FIBERWIRE 4-0 18 TAPR NDL (SUTURE)
SUT MERSILENE 4 0 P 3 (SUTURE) ×1 IMPLANT
SUT NYLON 9 0 VRM6 (SUTURE) IMPLANT
SUT PROLENE 6 0 P 1 18 (SUTURE) IMPLANT
SUT SILK 4 0 PS 2 (SUTURE) IMPLANT
SUT SUPRAMID 4-0 (SUTURE) IMPLANT
SUT VICRYL 4-0 PS2 18IN ABS (SUTURE) IMPLANT
SUTURE FIBERWR 4-0 18 TAPR NDL (SUTURE) IMPLANT
SYR BULB 3OZ (MISCELLANEOUS) ×2 IMPLANT
SYR CONTROL 10ML LL (SYRINGE) IMPLANT
TOWEL GREEN STERILE FF (TOWEL DISPOSABLE) ×4 IMPLANT
TRAY DSU PREP LF (CUSTOM PROCEDURE TRAY) IMPLANT
UNDERPAD 30X36 HEAVY ABSORB (UNDERPADS AND DIAPERS) ×2 IMPLANT

## 2019-10-14 NOTE — Discharge Instructions (Addendum)
° °  ° ° ° °[facility] Center Instructions °Hand Surgery ° °Wound Care: °Keep your hand elevated above the level of your heart.  Do not allow it to dangle by your side.  Keep the dressing dry and do not remove it unless your doctor advises you to do so.  He will usually change it at the time of your post-op visit.  Moving your fingers is advised to stimulate circulation but will depend on the site of your surgery.  If you have a splint applied, your doctor will advise you regarding movement. ° °Activity: °Do not drive or operate machinery today.  Rest today and then you may return to your normal activity and work as indicated by your physician. ° °Diet:  °Drink liquids today or eat a light diet.  You may resume a regular diet tomorrow.   ° °General expectations: °Pain for two to three days. °Fingers may become slightly swollen. ° °Call your doctor if any of the following occur: °Severe pain not relieved by pain medication. °Elevated temperature. °Dressing soaked with blood. °Inability to move fingers. °White or bluish color to fingers. ° ° °Regional Anesthesia Blocks ° °1. Numbness or the inability to move the "blocked" extremity may last from 3-48 hours after placement. The length of time depends on the medication injected and your individual response to the medication. If the numbness is not going away after 48 hours, call your surgeon. ° °2. The extremity that is blocked will need to be protected until the numbness is gone and the  Strength has returned. Because you cannot feel it, you will need to take extra care to avoid injury. Because it may be weak, you may have difficulty moving it or using it. You may not know what position it is in without looking at it while the block is in effect. ° °3. For blocks in the legs and feet, returning to weight bearing and walking needs to be done carefully. You will need to wait until the numbness is entirely gone and the strength has returned. You should be able to move your leg  and foot normally before you try and bear weight or walk. You will need someone to be with you when you first try to ensure you do not fall and possibly risk injury. ° °4. Bruising and tenderness at the needle site are common side effects and will resolve in a few days. ° °5. Persistent numbness or new problems with movement should be communicated to the surgeon or the [facility] ([phone])/ [facility] ([phone]). ° ° ° °Post Anesthesia Home Care Instructions ° °Activity: °Get plenty of rest for the remainder of the day. A responsible individual must stay with you for 24 hours following the procedure.  °For the next 24 hours, DO NOT: °-Drive a car °-Operate machinery °-Drink alcoholic beverages °-Take any medication unless instructed by your physician °-Make any legal decisions or sign important papers. ° °Meals: °Start with liquid foods such as gelatin or soup. Progress to regular foods as tolerated. Avoid greasy, spicy, heavy foods. If nausea and/or vomiting occur, drink only clear liquids until the nausea and/or vomiting subsides. Call your physician if vomiting continues. ° °Special Instructions/Symptoms: °Your throat may feel dry or sore from the anesthesia or the breathing tube placed in your throat during surgery. If this causes discomfort, gargle with warm salt water. The discomfort should disappear within 24 hours. ° °If you had a scopolamine patch placed behind your ear for   the management of post- operative nausea and/or vomiting: ° °1. The medication in the patch is effective for 72 hours, after which it should be removed.  Wrap patch in a tissue and discard in the trash. Wash hands thoroughly with soap and water. °2. You may remove the patch earlier than 72 hours if you experience unpleasant side effects which may include dry mouth, dizziness or visual disturbances. °3. Avoid touching the patch. Wash your hands with soap and water after contact with the patch. °  ° ° °

## 2019-10-14 NOTE — Transfer of Care (Signed)
Immediate Anesthesia Transfer of Care Note  Patient: Gary Fitzgerald  Procedure(s) Performed: WOUND EXPLORATION REPAIR EXTENSOR TENDON RIGHT THUMB WITH POSSIBLE EXTENSOR INDICUS PROPRIUS TO EXTENSOR POLICUS LONGUS TRANSFER (Right Thumb)  Patient Location: PACU  Anesthesia Type:MAC combined with regional for post-op pain  Level of Consciousness: awake, alert  and oriented  Airway & Oxygen Therapy: Patient Spontanous Breathing and Patient connected to face mask oxygen  Post-op Assessment: Report given to RN and Post -op Vital signs reviewed and stable  Post vital signs: Reviewed and stable  Last Vitals:  Vitals Value Taken Time  BP 112/57 10/14/19 1403  Temp    Pulse 79 10/14/19 1404  Resp 14 10/14/19 1404  SpO2 98 % 10/14/19 1404  Vitals shown include unvalidated device data.  Last Pain:  Vitals:   10/14/19 1239  TempSrc: Temporal  PainSc: 2          Complications: No apparent anesthesia complications

## 2019-10-14 NOTE — Progress Notes (Signed)
Assisted Dr. Ossey with right, ultrasound guided, supraclavicular block. Side rails up, monitors on throughout procedure. See vital signs in flow sheet. Tolerated Procedure well. 

## 2019-10-14 NOTE — Anesthesia Preprocedure Evaluation (Addendum)
Anesthesia Evaluation  Patient identified by MRN, date of birth, ID band Patient awake    Reviewed: Allergy & Precautions, NPO status , Patient's Chart, lab work & pertinent test results  History of Anesthesia Complications (+) PONV  Airway Mallampati: I  TM Distance: >3 FB Neck ROM: Full    Dental   Pulmonary    Pulmonary exam normal        Cardiovascular hypertension, Pt. on medications Normal cardiovascular exam     Neuro/Psych    GI/Hepatic   Endo/Other    Renal/GU      Musculoskeletal   Abdominal   Peds  Hematology   Anesthesia Other Findings   Reproductive/Obstetrics                             Anesthesia Physical Anesthesia Plan  ASA: II  Anesthesia Plan: Regional and MAC   Post-op Pain Management:    Induction: Intravenous  PONV Risk Score and Plan: 2 and Ondansetron and Treatment may vary due to age or medical condition  Airway Management Planned: Nasal Cannula  Additional Equipment:   Intra-op Plan:   Post-operative Plan:   Informed Consent: I have reviewed the patients History and Physical, chart, labs and discussed the procedure including the risks, benefits and alternatives for the proposed anesthesia with the patient or authorized representative who has indicated his/her understanding and acceptance.       Plan Discussed with: CRNA and Surgeon  Anesthesia Plan Comments:         Anesthesia Quick Evaluation

## 2019-10-14 NOTE — Anesthesia Postprocedure Evaluation (Signed)
Anesthesia Post Note  Patient: Gary Fitzgerald  Procedure(s) Performed: WOUND EXPLORATION REPAIR EXTENSOR TENDON RIGHT THUMB WITH POSSIBLE EXTENSOR INDICUS PROPRIUS TO EXTENSOR POLICUS LONGUS TRANSFER (Right Thumb)     Patient location during evaluation: PACU Anesthesia Type: Regional Level of consciousness: awake and alert and patient cooperative Pain management: pain level controlled Vital Signs Assessment: post-procedure vital signs reviewed and stable Respiratory status: spontaneous breathing and respiratory function stable Cardiovascular status: stable Anesthetic complications: no    Last Vitals:  Vitals:   10/14/19 1445 10/14/19 1515  BP: 129/77 113/77  Pulse: 62 66  Resp: 12 16  Temp:  36.8 C  SpO2: 96% 98%    Last Pain:  Vitals:   10/14/19 1515  TempSrc:   PainSc: 0-No pain                 Yazid Pop DAVID

## 2019-10-14 NOTE — Op Note (Addendum)
NAME: DOYLE KUNATH MEDICAL RECORD NO: [id number] DATE OF BIRTH: 1966-06-10 FACILITY: [facility] LOCATION: [facility] PHYSICIAN: Tami Ribas, MD   OPERATIVE REPORT   DATE OF PROCEDURE: 10/14/19    PREOPERATIVE DIAGNOSIS:   Right thumb EPL tendon laceration   POSTOPERATIVE DIAGNOSIS:   Right thumb EPL tendon laceration   PROCEDURE:   Right thumb repair of EPL tendon zone 2   SURGEON:  Betha Loa, M.D.   ASSISTANT: Cindee Salt, MD   ANESTHESIA:  Regional with sedation   INTRAVENOUS FLUIDS:  Per anesthesia flow sheet.   ESTIMATED BLOOD LOSS:  Minimal.   COMPLICATIONS:  None.   SPECIMENS:   Cultures to micro   TOURNIQUET TIME:    Total Tourniquet Time Documented: Upper Arm (Right) - 20 minutes Total: Upper Arm (Right) - 20 minutes    DISPOSITION:  Stable to PACU.   INDICATIONS: 53 year old male states he sustained a laceration to the dorsum of the right thumb while pushing trash down in the garbage can.  He has had inability to extend at the IP joint.  He wishes to proceed with operative exploration with repair of tendon as necessary. Risks, benefits and alternatives of surgery were discussed including the risks of blood loss, infection, damage to nerves, vessels, tendons, ligaments, bone for surgery, need for additional surgery, complications with wound healing, continued pain, stiffness.  He voiced understanding of these risks and elected to proceed.  OPERATIVE COURSE:  After being identified preoperatively by myself,  the patient and I agreed on the procedure and site of the procedure.  The surgical site was marked.  Surgical consent had been signed. He was given IV antibiotics as preoperative antibiotic prophylaxis. He was transferred to the operating room and placed on the operating table in supine position with the Right upper extremity on an arm board.  Sedation was induced by the anesthesiologist. A regional block had been performed by anesthesia in  preoperative holding.   Right upper extremity was prepped and draped in normal sterile orthopedic fashion.  A surgical pause was performed between the surgeons, anesthesia, and operating room staff and all were in agreement as to the patient, procedure, and site of procedure.  Tourniquet at the proximal aspect of the extremity was inflated to 250 mmHg after exsanguination of the arm with an Esmarch bandage.    The wound was opened.  There was no gross purulence.  Cultures were taken for aerobes and anaerobes.  The wound was extended proximally and distally.  There was laceration of the EPL tendon over the proximal phalanx.  This involved approximately 80% of the tendon.  There was some remaining tendon intact at the ulnar side.  The tendon ends were cleaned up.  A 4-0 Mersilene suture was used in a figure-of-eight fashion to reapproximate the tendon ends.  Good reapproximation was obtained without undue tension.  The wound was copiously irrigated with sterile saline.  Was closed with 4-0 nylon in a horizontal mattress fashion.  It was dressed with sterile Xeroform 4 x 4's and wrapped with a Kerlix bandage.  A thumb spica splint was placed with the thumb in extension.  This was wrapped with Kerlix and Ace bandage.  The tourniquet was deflated at 20 minutes.  Fingertips were pink with brisk capillary refill after deflation of tourniquet.  The operative  drapes were broken down.  The patient was awoken from anesthesia safely.  He was transferred back to the stretcher and taken to PACU in stable  condition.  I will see him back in the office in 1 week for postoperative followup.  I will give him a prescription for Norco 5/325 1 tab PO q6 hours prn pain, dispense # 20.  He currently takes hydrocodone for pre-existing pain.  He can continue his current pain medication regimen and use this as breakthrough medication.     Leanora Cover, MD Electronically signed, 10/14/19

## 2019-10-14 NOTE — Op Note (Signed)
I assisted Surgeon(s) and Role:    * Leanora Cover, MD - Primary on the Procedure(s): WOUND EXPLORATION REPAIR EXTENSOR TENDON RIGHT THUMB WITH POSSIBLE EXTENSOR INDICUS PROPRIUS TO EXTENSOR POLICUS LONGUS TRANSFER on 10/14/2019.  I provided assistance on this case as follows: setup, approach, debridement, repair of the tendon, closure of the wound and application of the dressings and splint.  Electronically signed by: Daryll Brod, MD Date: 10/14/2019 Time: 1:58 PM

## 2019-10-14 NOTE — H&P (Signed)
Gary Fitzgerald is an 53 y.o. male.   Chief Complaint: right thumb tendon laceration HPI: 53 yo male states he lacerated right thumb on can lid 10/05/2019.  Unable to fully extend ip joint of thumb.  He wishes to undergo operative exploration of wound with repair of tendon.  Allergies:  Allergies  Allergen Reactions  . Almond Oil   . Apple   . Banana   . Beef-Derived Products   . Corn-Containing Products   . Cucumber Extract   . Drug Ingredient [Poultry Meal]   . Eggs Or Egg-Derived Products   . Peanut-Containing Drug Products     Causes digestive issues  . Percocet [Oxycodone-Acetaminophen] Hives and Itching  . Soy Allergy   . Wheat Bran   . Darvon [Propoxyphene] Hives, Itching and Rash    Past Medical History:  Diagnosis Date  . Classic migraine with aura 11/11/2013  . 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 Dizziness   . Headache(784.0)   . Hearing loss    asymmetrical  . History of kidney stones   . Hypertension   . Irritable bowel syndrome   . PONV (postoperative nausea and vomiting)   . Renal calculi    Lithotripsy  . Testosterone deficiency     Past Surgical History:  Procedure Laterality Date  . ANTERIOR CRUCIATE LIGAMENT REPAIR     x2  . EXTRACORPOREAL SHOCK WAVE LITHOTRIPSY Left 03/05/2017   Procedure: LEFT EXTRACORPOREAL SHOCK WAVE LITHOTRIPSY (ESWL);  Surgeon: Gary Aloe, MD;  Location: [facility];  Service: Urology;  Laterality: Left;  . EXTRACORPOREAL SHOCK WAVE LITHOTRIPSY Right 11/26/2017   Procedure: RIGHT EXTRACORPOREAL SHOCK WAVE LITHOTRIPSY (ESWL);  Surgeon: Gary Mons, MD;  Location: [facility];  Service: Urology;  Laterality: Right;  . EXTRACORPOREAL SHOCK WAVE LITHOTRIPSY Left 12/05/2018   Procedure: EXTRACORPOREAL SHOCK WAVE LITHOTRIPSY (ESWL);  Surgeon: Gary Rhodes, MD;  Location: [facility];  Service: Urology;  Laterality: Left;  . EYE SURGERY     lasik surgery bilat   . HIP SURGERY Right   . KNEE SURGERY Left    x2 left knee  .  SHOULDER SURGERY     Bilateral  . Thumb surgery     Tendon repair, right    Family History: Family History  Problem Relation Age of Onset  . High blood pressure Mother   . Hypertension Mother   . High blood pressure Father   . Cancer - Prostate Father   . Skin cancer Father   . Hypertension Father   . Hypertension Brother   . Diabetes Brother     Social History:   reports that he has never smoked. He has never used smokeless tobacco. He reports previous drug use. He reports that he does not drink alcohol.  Medications: Medications Prior to Admission  Medication Sig Dispense Refill  . amLODipine (NORVASC) 5 MG tablet Take 5 mg by mouth daily.  3  . ibuprofen (ADVIL) 600 MG tablet Take 600 mg by mouth every 6 (six) hours as needed.    . loratadine (CLARITIN) 10 MG tablet Take 10 mg by mouth daily.  5  . montelukast (SINGULAIR) 10 MG tablet Take 10 mg by mouth daily.  5  . Multiple Vitamins-Minerals (MULTIVITAMIN WITH MINERALS) tablet Take 1 tablet by mouth daily.    . potassium citrate (UROCIT-K) 10 MEQ (1080 MG) SR tablet Take 15 mEq by mouth 2 (two) times daily.    . tamsulosin (FLOMAX) 0.4 MG CAPS capsule Take 1 capsule (0.4 mg total) by  mouth daily. 10 capsule 0  . tamsulosin (FLOMAX) 0.4 MG CAPS capsule Take 1 capsule (0.4 mg total) by mouth daily after supper. 30 capsule 0  . testosterone cypionate (DEPOTESTOTERONE CYPIONATE) 200 MG/ML injection Inject 200 mg into the muscle every 7 (seven) days.     . Vitamin D, Ergocalciferol, (DRISDOL) 1.25 MG (50000 UT) CAPS capsule TAKE 1 CAPSULE BY MOUTH WEEKLY  3  . Eluxadoline (VIBERZI) 100 MG TABS Take 100 mg by mouth 2 (two) times daily.     Marland Kitchen HYDROcodone-acetaminophen (NORCO) 10-325 MG tablet Take 1-2 tablets by mouth every 4 (four) hours as needed for moderate pain. Maximum dose per 24 hours - 8 pills (Patient taking differently: Take 1-2 tablets by mouth every 4 (four) hours as needed for moderate pain. Maximum dose per 24 hours -  8 pills) 20 tablet 0  . HYDROcodone-acetaminophen (NORCO/VICODIN) 5-325 MG tablet Take 2 tablets by mouth every 6 (six) hours as needed. 20 tablet 0  . promethazine (PHENERGAN) 25 MG tablet Take 1 tablet (25 mg total) by mouth every 6 (six) hours as needed for nausea or vomiting. (Patient taking differently: Take 25 mg by mouth every 6 (six) hours as needed for nausea or vomiting. ) 20 tablet 0  . SUMAtriptan (IMITREX) 6 MG/0.5ML SOLN injection Inject 0.5 mLs (6 mg total) into the skin 2 (two) times daily as needed for migraine or headache. May repeat in 2 hours if headache persists or recurs. (Patient taking differently: Inject 6 mg into the skin 2 (two) times daily as needed for migraine or headache. May repeat in 2 hours if headache persists or recurs.) 4 vial 3    No results found for this or any previous visit (from the past 48 hour(s)).  No results found.   A comprehensive review of systems was negative.  Height 5\' 9"  (1.753 m), weight 86.2 kg.  General appearance: alert, cooperative and appears stated age Head: Normocephalic, without obvious abnormality, atraumatic Neck: supple, symmetrical, trachea midline Cardio: regular rate and rhythm Resp: clear to auscultation bilaterally Extremities: Intact sensation and capillary refill all digits.  +fpl/io.  Laceration to dorsum right thumb.  Unable to fully extend right thumb ip joint Pulses: 2+ and symmetric Skin: Skin color, texture, turgor normal. No rashes or lesions Neurologic: Grossly normal Incision/Wound: as above  Assessment/Plan Right thumb epl tendon laceration.  Non operative and operative treatment options have been discussed with the patient and patient wishes to proceed with operative treatment. Risks, benefits, and alternatives of surgery have been discussed and the patient agrees with the plan of care.   10/14/2019, 12:37 PM

## 2019-10-14 NOTE — Anesthesia Procedure Notes (Signed)
Anesthesia Regional Block: Supraclavicular block   Pre-Anesthetic Checklist: ,, timeout performed, Correct Patient, Correct Site, Correct Laterality, Correct Procedure, Correct Position, site marked, Risks and benefits discussed,  Surgical consent,  Pre-op evaluation,  At surgeon's request and post-op pain management  Laterality: Right  Prep: chloraprep       Needles:   Needle Type: Echogenic Stimulator Needle     Needle Length: 9cm  Needle Gauge: 21     Additional Needles:   Procedures:, nerve stimulator,,,,,,,   Nerve Stimulator or Paresthesia:  Response: 0.4 mA,   Additional Responses:   Narrative:  Start time: 10/14/2019 12:51 PM End time: 10/14/2019 1:01 PM Injection made incrementally with aspirations every 5 mL.  Performed by: Personally  Anesthesiologist: Lillia Abed, MD  Additional Notes: Monitors applied. Patient sedated. Sterile prep and drape,hand hygiene and sterile gloves were used. Relevant anatomy identified.Needle position confirmed.Local anesthetic injected incrementally after negative aspiration. Local anesthetic spread visualized around nerve(s). Vascular puncture avoided. No complications. Image printed for medical record.The patient tolerated the procedure well.

## 2019-10-15 ENCOUNTER — Encounter: Payer: Self-pay | Admitting: *Deleted

## 2019-10-19 LAB — AEROBIC/ANAEROBIC CULTURE W GRAM STAIN (SURGICAL/DEEP WOUND)
Culture: NO GROWTH
Gram Stain: NONE SEEN

## 2020-02-13 ENCOUNTER — Telehealth: Payer: Self-pay | Admitting: Orthopaedic Surgery

## 2020-02-13 NOTE — Telephone Encounter (Signed)
Patient called.   He requested an appointment for a cortisone injection. I informed him that the next available was not until the first week in May but he stated that he would be out of town that week. He is wanting to know if he could be worked in sometime next week.   Call back: [phone]

## 2020-02-13 NOTE — Telephone Encounter (Signed)
Called and spoke with patient. Worked him in next Tuesday and scheduled.

## 2020-02-17 ENCOUNTER — Other Ambulatory Visit: Payer: Self-pay

## 2020-02-17 ENCOUNTER — Encounter: Payer: Self-pay | Admitting: Orthopaedic Surgery

## 2020-02-17 ENCOUNTER — Ambulatory Visit: Payer: BC Managed Care – PPO | Admitting: Orthopaedic Surgery

## 2020-02-17 DIAGNOSIS — M25512 Pain in left shoulder: Secondary | ICD-10-CM

## 2020-02-17 DIAGNOSIS — G8929 Other chronic pain: Secondary | ICD-10-CM

## 2020-02-17 MED ORDER — LIDOCAINE HCL 2 % IJ SOLN
2.0000 mL | INTRAMUSCULAR | Status: AC | PRN
  Administered 2020-02-17: 2 mL

## 2020-02-17 MED ORDER — BUPIVACAINE HCL 0.5 % IJ SOLN
2.0000 mL | INTRAMUSCULAR | Status: AC | PRN
  Administered 2020-02-17: 2 mL via INTRA_ARTICULAR

## 2020-02-17 MED ORDER — METHYLPREDNISOLONE ACETATE 40 MG/ML IJ SUSP
80.0000 mg | INTRAMUSCULAR | Status: AC | PRN
  Administered 2020-02-17: 80 mg via INTRA_ARTICULAR

## 2020-02-17 NOTE — Progress Notes (Signed)
Office Visit Note   Patient: Gary Fitzgerald           Date of Birth: 1966/06/13           MRN: [id number] Visit Date: 02/17/2020              Requested by: Gwendel Hanson [facility]  [place],  [place] [address] PCP: Secundino Ginger, PA-C   Assessment & Plan: Visit Diagnoses:  1. Chronic left shoulder pain     Plan: Merry Proud is experiencing recurrent symptoms of impingement left shoulder.  He has had excellent response to subacromial cortisone injections in the past.  The last injection was in December 2020.  He is having difficulty raising his arm over his head and pain at night.  He has had prior surgery on both shoulders for subacromial decompression.  I am going to reinject his shoulder with cortisone today in the subacromial region is going on vacation and will order an MRI.  I am concerned that he may have a rotator cuff tear  Follow-Up Instructions: Return After MRI scan left shoulder.   Orders:  Orders Placed This Encounter  Procedures  . Large Joint Inj: L subacromial bursa  . MR Shoulder Left w/o contrast   No orders of the defined types were placed in this encounter.     Procedures: Large Joint Inj: L subacromial bursa on 02/17/2020 9:44 AM Indications: pain and diagnostic evaluation Details: 25 G 1.5 in needle, anterolateral approach  Arthrogram: No  Medications: 2 mL lidocaine 2 %; 2 mL bupivacaine 0.5 %; 80 mg methylPREDNISolone acetate 40 MG/ML Consent was given by the patient. Immediately prior to procedure a time out was called to verify the correct patient, procedure, equipment, support staff and site/side marked as required. Patient was prepped and draped in the usual sterile fashion.       Clinical Data: No additional findings.   Subjective: Chief Complaint  Patient presents with  . Left Shoulder - Pain  Patient has left shoulder pain today. Requesting injection in his left shoulder today.  Merry Proud is had  several prior subacromial cortisone injections in the left shoulder.  The last was in December 2020.  Has done well only to have recurrent pain and difficulty raising his arm over his head.  He has also had prior arthroscopic procedures on both shoulders for what I believe was impingement.  HPI  Review of Systems   Objective: Vital Signs: There were no vitals taken for this visit.  Physical Exam Constitutional:      Appearance: He is well-developed.  Eyes:     Pupils: Pupils are equal, round, and reactive to light.  Pulmonary:     Effort: Pulmonary effort is normal.  Skin:    General: Skin is warm and dry.  Neurological:     Mental Status: He is alert and oriented to person, place, and time.  Psychiatric:        Behavior: Behavior normal.     Ortho Exam left shoulder with positive impingement and empty can testing.  Able to raise his left arm fully overhead but with a circuitous arc of motion related to pain.  No crepitation.  Good strength.  Good grip.  Neurologically intact.  No pain referred to the left shoulder the range of motion of the cervical spine.  Negative Speed sign  Specialty Comments:  No specialty comments available.  Imaging: No results found.   PMFS History: Patient Active  Problem List   Diagnosis Date Noted  . Pain in left shoulder 06/24/2019  . Low back pain 06/24/2019  . Classic migraine with aura 11/11/2013  . Dizziness and giddiness 11/11/2013  . Coital headache 11/11/2013   Past Medical History:  Diagnosis Date  . Classic migraine with aura 11/11/2013  . 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 Dizziness   . Headache(784.0)   . Hearing loss    asymmetrical  . History of kidney stones   . Hypertension   . Irritable bowel syndrome   . PONV (postoperative nausea and vomiting)   . Renal calculi    Lithotripsy  . Testosterone deficiency     Family History  Problem Relation Age of Onset  . High blood pressure Mother   . Hypertension Mother   . High  blood pressure Father   . Cancer - Prostate Father   . Skin cancer Father   . Hypertension Father   . Hypertension Brother   . Diabetes Brother     Past Surgical History:  Procedure Laterality Date  . ANTERIOR CRUCIATE LIGAMENT REPAIR     x2  . EXTRACORPOREAL SHOCK WAVE LITHOTRIPSY Left 03/05/2017   Procedure: LEFT EXTRACORPOREAL SHOCK WAVE LITHOTRIPSY (ESWL);  Surgeon: Jerilee Field, MD;  Location: [facility];  Service: Urology;  Laterality: Left;  . EXTRACORPOREAL SHOCK WAVE LITHOTRIPSY Right 11/26/2017   Procedure: RIGHT EXTRACORPOREAL SHOCK WAVE LITHOTRIPSY (ESWL);  Surgeon: Rene Paci, MD;  Location: [facility];  Service: Urology;  Laterality: Right;  . EXTRACORPOREAL SHOCK WAVE LITHOTRIPSY Left 12/05/2018   Procedure: EXTRACORPOREAL SHOCK WAVE LITHOTRIPSY (ESWL);  Surgeon: Ihor Gully, MD;  Location: [facility];  Service: Urology;  Laterality: Left;  . EYE SURGERY     lasik surgery bilat   . HIP SURGERY Right   . KNEE SURGERY Left    x2 left knee  . NERVE, TENDON AND ARTERY REPAIR Right 10/14/2019   Procedure: WOUND EXPLORATION REPAIR EXTENSOR TENDON RIGHT THUMB WITH POSSIBLE EXTENSOR INDICUS PROPRIUS TO EXTENSOR POLICUS LONGUS TRANSFER;  Surgeon: Betha Loa, MD;  Location: [facility];  Service: Orthopedics;  Laterality: Right;  . SHOULDER SURGERY     Bilateral  . Thumb surgery     Tendon repair, right   Social History   Occupational History    Comment: [employer]  Tobacco Use  . Smoking status: Never Smoker  . Smokeless tobacco: Never Used  Substance and Sexual Activity  . Alcohol use: No  . Drug use: Not Currently  . Sexual activity: Not on file

## 2020-03-10 ENCOUNTER — Telehealth: Payer: Self-pay | Admitting: Orthopaedic Surgery

## 2020-03-10 NOTE — Telephone Encounter (Signed)
Faxed to number provided

## 2020-03-10 NOTE — Telephone Encounter (Signed)
Alice from [facility] called requesting Dr. Cleophas Dunker fax orders for MRI of left shoulder no contrast for patient. Alice said fax needs to be sent to [facility] on [address] fax number [phone].Marland Kitchen Alice phone number is [phone].

## 2020-03-11 ENCOUNTER — Telehealth: Payer: Self-pay | Admitting: Orthopaedic Surgery

## 2020-03-11 NOTE — Telephone Encounter (Signed)
Refaxed again and received confirmation that fax went through successfully.

## 2020-03-11 NOTE — Telephone Encounter (Signed)
[facility] called. Patient is having a MRI at 2pm today. They need a referral faxed [phone]

## 2020-04-28 ENCOUNTER — Ambulatory Visit: Payer: BC Managed Care – PPO | Admitting: Orthopaedic Surgery

## 2020-04-28 ENCOUNTER — Encounter: Payer: Self-pay | Admitting: Orthopaedic Surgery

## 2020-04-28 ENCOUNTER — Other Ambulatory Visit: Payer: Self-pay

## 2020-04-28 VITALS — Ht 69.0 in | Wt 193.0 lb

## 2020-04-28 DIAGNOSIS — M25512 Pain in left shoulder: Secondary | ICD-10-CM

## 2020-04-28 DIAGNOSIS — G8929 Other chronic pain: Secondary | ICD-10-CM

## 2020-04-28 NOTE — Progress Notes (Signed)
Office Visit Note   Patient: Gary Fitzgerald           Date of Birth: [birth year]           MRN: [id number] Visit Date: 04/28/2020              Requested by: Gary Fitzgerald  [address] [place],  [place] [address] PCP: Gary Rud, PA-C   Assessment & Plan: Visit Diagnoses:  1. Chronic left shoulder pain     Plan: [name] was last seen in April for subacromial cortisone injection his left shoulder he.  He notes that that made a difference but still having some difficulty with overhead activity i.e. throwing a football or carrying heavy objects.  An MRI scan was performed through the [facility] system.  I have a copy of the report from his 03/11/2020 study demonstrating a partial thickness partial width tear in the distal supraspinatus tendon and mild AC joint arthrosis with a previous acromioplasty.  The acromion was flat.  Long head of the biceps was intact as well as the infraspinatus teres minor and subscapularis.  There was tearing of the distal tendon intra-articularly measuring 8 to 10 mm in anterior to posterior but no full-thickness retracted tears and no atrophy.  Long discussion regarding treatment options.  I think a course of physical therapy would be helpful.  At some point he may be a candidate for repeat shoulder arthroscopy will monitor his response  Follow-Up Instructions: Return if symptoms worsen or fail to improve.   Orders:  No orders of the defined types were placed in this encounter.  No orders of the defined types were placed in this encounter.     Procedures: No procedures performed   Clinical Data: No additional findings.   Subjective: Chief Complaint  Patient presents with  . Left Shoulder - Follow-up    MRI review  Patient presents today for follow up on his left shoulder. He had an MRI at [facility] on 03-11-2020. He is here today for those results. He received a cortisone injection on 02-18-2020 and states that it  helped. He said that it is still helping.   HPI  Review of Systems   Objective: Vital Signs: Ht 5\' 9"  (1.753 m)   Wt 193 lb (87.5 kg)   BMI 28.50 kg/m   Physical Exam Constitutional:      Appearance: He is well-developed.  Eyes:     Pupils: Pupils are equal, round, and reactive to light.  Pulmonary:     Effort: Pulmonary effort is normal.  Skin:    General: Skin is warm and dry.  Neurological:     Mental Status: He is alert and oriented to person, place, and time.  Psychiatric:        Behavior: Behavior normal.     Ortho Exam awake alert and oriented x3.  Comfortable sitting.  Some mild pain with external rotation of his left shoulder in the impingement position.  Negative Speed sign.  Minimally positive empty can testing.  Able to place his arm over his head with somewhat of a circuitous arc of motion with pain along the anterior subacromial region.  No pain to palpation.  Skin intact.  Good grip and release.  No crepitation Specialty Comments:  No specialty comments available.  Imaging: No results found.   PMFS History: Patient Active Problem List   Diagnosis Date Noted  . Pain in left shoulder 06/24/2019  . Low back  pain 06/24/2019  . Classic migraine with aura 11/11/2013  . Dizziness and giddiness 11/11/2013  . Coital headache 11/11/2013   Past Medical History:  Diagnosis Date  . Classic migraine with aura 11/11/2013  . 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 Dizziness   . Headache(784.0)   . Hearing loss    asymmetrical  . History of kidney stones   . Hypertension   . Irritable bowel syndrome   . PONV (postoperative nausea and vomiting)   . Renal calculi    Lithotripsy  . Testosterone deficiency     Family History  Problem Relation Age of Onset  . High blood pressure Mother   . Hypertension Mother   . High blood pressure Father   . Cancer - Prostate Father   . Skin cancer Father   . Hypertension Father   . Hypertension Brother   . Diabetes Brother       Past Surgical History:  Procedure Laterality Date  . ANTERIOR CRUCIATE LIGAMENT REPAIR     x2  . EXTRACORPOREAL SHOCK WAVE LITHOTRIPSY Left 03/05/2017   Procedure: LEFT EXTRACORPOREAL SHOCK WAVE LITHOTRIPSY (ESWL);  Surgeon: Jerilee Field, MD;  Location: [facility];  Service: Urology;  Laterality: Left;  . EXTRACORPOREAL SHOCK WAVE LITHOTRIPSY Right 11/26/2017   Procedure: RIGHT EXTRACORPOREAL SHOCK WAVE LITHOTRIPSY (ESWL);  Surgeon: Rene Paci, MD;  Location: [facility];  Service: Urology;  Laterality: Right;  . EXTRACORPOREAL SHOCK WAVE LITHOTRIPSY Left 12/05/2018   Procedure: EXTRACORPOREAL SHOCK WAVE LITHOTRIPSY (ESWL);  Surgeon: Ihor Gully, MD;  Location: [facility];  Service: Urology;  Laterality: Left;  . EYE SURGERY     lasik surgery bilat   . HIP SURGERY Right   . KNEE SURGERY Left    x2 left knee  . NERVE, TENDON AND ARTERY REPAIR Right 10/14/2019   Procedure: WOUND EXPLORATION REPAIR EXTENSOR TENDON RIGHT THUMB WITH POSSIBLE EXTENSOR INDICUS PROPRIUS TO EXTENSOR POLICUS LONGUS TRANSFER;  Surgeon: Betha Loa, MD;  Location: [facility];  Service: Orthopedics;  Laterality: Right;  . SHOULDER SURGERY     Bilateral  . Thumb surgery     Tendon repair, right   Social History   Occupational History    Comment: [employer]  Tobacco Use  . Smoking status: Never Smoker  . Smokeless tobacco: Never Used  Vaping Use  . Vaping Use: Never used  Substance and Sexual Activity  . Alcohol use: No  . Drug use: Not Currently  . Sexual activity: Not on file

## 2020-07-21 ENCOUNTER — Other Ambulatory Visit: Payer: Self-pay | Admitting: Urology

## 2020-07-22 ENCOUNTER — Other Ambulatory Visit (HOSPITAL_COMMUNITY)
Admission: RE | Admit: 2020-07-22 | Discharge: 2020-07-22 | Disposition: A | Discharge status: Home / Self Care | Payer: BC Managed Care – PPO | Source: Ambulatory Visit | Attending: Urology | Admitting: Urology

## 2020-07-22 DIAGNOSIS — Z01812 Encounter for preprocedural laboratory examination: Secondary | ICD-10-CM | POA: Insufficient documentation

## 2020-07-22 DIAGNOSIS — Z20822 Contact with and (suspected) exposure to covid-19: Secondary | ICD-10-CM | POA: Insufficient documentation

## 2020-07-22 LAB — SARS CORONAVIRUS 2 (TAT 6-24 HRS): SARS Coronavirus 2: NEGATIVE

## 2020-07-22 NOTE — Progress Notes (Signed)
Patient to arrive at 0645 on 07/26/2020. History and medications reviewed. Pre-procedure instructions given. NPO after MN. Norvasc in AM with sip of water. Driver secured.

## 2020-07-25 NOTE — Op Note (Signed)
See [facility] OP note scanned into chart. 

## 2020-07-25 NOTE — H&P (Signed)
H&P  Chief Complaint: Kidney stone  History of Present Illness: 54 yo male presents for ESL of a 7 mm Rt UU stone. This is the 1st treatment for a possible staged procedure.  Past Medical History:  Diagnosis Date  . Classic migraine with aura 11/11/2013  . 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 Dizziness   . Headache(784.0)   . Hearing loss    asymmetrical  . History of kidney stones   . Hypertension   . Irritable bowel syndrome   . PONV (postoperative nausea and vomiting)   . Renal calculi    Lithotripsy  . Testosterone deficiency     Past Surgical History:  Procedure Laterality Date  . ANTERIOR CRUCIATE LIGAMENT REPAIR     x2  . EXTRACORPOREAL SHOCK WAVE LITHOTRIPSY Left 03/05/2017   Procedure: LEFT EXTRACORPOREAL SHOCK WAVE LITHOTRIPSY (ESWL);  Surgeon: Jerilee Field, MD;  [facility];  Service: Urology;  Laterality: Left;  . EXTRACORPOREAL SHOCK WAVE LITHOTRIPSY Right 11/26/2017   Procedure: RIGHT EXTRACORPOREAL SHOCK WAVE LITHOTRIPSY (ESWL);  Surgeon: Rene Paci, MD;  [facility];  Service: Urology;  Laterality: Right;  . EXTRACORPOREAL SHOCK WAVE LITHOTRIPSY Left 12/05/2018   Procedure: EXTRACORPOREAL SHOCK WAVE LITHOTRIPSY (ESWL);  Surgeon: Ihor Gully, MD;  [facility];  Service: Urology;  Laterality: Left;  . EYE SURGERY     lasik surgery bilat   . HIP SURGERY Right   . KNEE SURGERY Left    x2 left knee  . NERVE, TENDON AND ARTERY REPAIR Right 10/14/2019   Procedure: WOUND EXPLORATION REPAIR EXTENSOR TENDON RIGHT THUMB WITH POSSIBLE EXTENSOR INDICUS PROPRIUS TO EXTENSOR POLICUS LONGUS TRANSFER;  Surgeon: Betha Loa, MD;  Location: [facility];  Service: Orthopedics;  Laterality: Right;  . SHOULDER SURGERY     Bilateral  . Thumb surgery     Tendon repair, right    Home Medications:  Allergies as of 07/25/2020      Reactions   Almond Oil    Apple    Banana    Beef-derived Products    Corn-containing Products    Cucumber  Extract    Drug Ingredient [poultry Meal]    Eggs Or Egg-derived Products    Peanut-containing Drug Products    Causes digestive issues   Percocet [oxycodone-acetaminophen] Hives, Itching   Soy Allergy    Wheat Bran    Darvon [propoxyphene] Hives, Itching, Rash      Medication List    Notice   Cannot display discharge medications because the patient has not yet been admitted.     Allergies:  Allergies  Allergen Reactions  . Almond Oil   . Apple   . Banana   . Beef-Derived Products   . Corn-Containing Products   . Cucumber Extract   . Drug Ingredient [Poultry Meal]   . Eggs Or Egg-Derived Products   . Peanut-Containing Drug Products     Causes digestive issues  . Percocet [Oxycodone-Acetaminophen] Hives and Itching  . Soy Allergy   . Wheat Bran   . Darvon [Propoxyphene] Hives, Itching and Rash    Family History  Problem Relation Age of Onset  . High blood pressure Mother   . Hypertension Mother   . High blood pressure Father   . Cancer - Prostate Father   . Skin cancer Father   . Hypertension Father   . Hypertension Brother   . Diabetes Brother     Social History:  reports that he has never smoked. He has never used  smokeless tobacco. He reports previous drug use. He reports that he does not drink alcohol.  ROS: A complete review of systems was performed.  All systems are negative except for pertinent findings as noted.  Physical Exam:  Vital signs in last 24 hours: There were no vitals taken for this visit. Constitutional:  Alert and oriented, No acute distress Cardiovascular: Regular rate  Respiratory: Normal respiratory effort GI: Abdomen is soft, nontender, nondistended, no abdominal masses. No CVAT.  Genitourinary: Normal male phallus, testes are descended bilaterally and non-tender and without masses, scrotum is normal in appearance without lesions or masses, perineum is normal on inspection. Lymphatic: No lymphadenopathy Neurologic: Grossly intact,  no focal deficits Psychiatric: Normal mood and affect  Laboratory Data:  No results for input(s): WBC, HGB, HCT, PLT in the last 72 hours.  No results for input(s): NA, K, CL, GLUCOSE, BUN, CALCIUM, CREATININE in the last 72 hours.  Invalid input(s): CO3   No results found for this or any previous visit (from the past 24 hour(s)). Recent Results (from the past 240 hour(s))  SARS CORONAVIRUS 2 (TAT 6-24 HRS) Nasopharyngeal Nasopharyngeal Swab     Status: None   Collection Time: 07/22/20 11:50 AM   Specimen: Nasopharyngeal Swab  Result Value Ref Range Status   SARS Coronavirus 2 NEGATIVE NEGATIVE Final    Comment: (NOTE) SARS-CoV-2 target nucleic acids are NOT DETECTED.  The SARS-CoV-2 RNA is generally detectable in upper and lower respiratory specimens during the acute phase of infection. Negative results do not preclude SARS-CoV-2 infection, do not rule out co-infections with other pathogens, and should not be used as the sole basis for treatment or other patient management decisions. Negative results must be combined with clinical observations, patient history, and epidemiological information. The expected result is Negative.  Fact Sheet for Patients: HairSlick.no  Fact Sheet for Healthcare Providers: quierodirigir.com  This test is not yet approved or cleared by the [place] FDA and  has been authorized for detection and/or diagnosis of SARS-CoV-2 by FDA under an Emergency Use Authorization (EUA). This EUA will remain  in effect (meaning this test can be used) for the duration of the COVID-19 declaration under Se ction 564(b)(1) of the Act, 21 U.S.C. section 360bbb-3(b)(1), unless the authorization is terminated or revoked sooner.  Performed at [facility], [address], [place], [place] [address]     Renal Function: No results for input(s): CREATININE in the last 168 hours. CrCl cannot be  calculated (Patient's most recent lab result is older than the maximum 21 days allowed.).  Radiologic Imaging: No results found.  Impression/Assessment:  Rt UU stone  Plan:  Rt ESL

## 2020-07-26 ENCOUNTER — Encounter (HOSPITAL_BASED_OUTPATIENT_CLINIC_OR_DEPARTMENT_OTHER)
Admission: RE | Disposition: A | Discharge status: Home / Self Care | Payer: Self-pay | Source: Home / Self Care | Attending: Urology

## 2020-07-26 ENCOUNTER — Ambulatory Visit (HOSPITAL_BASED_OUTPATIENT_CLINIC_OR_DEPARTMENT_OTHER)
Admission: RE | Admit: 2020-07-26 | Discharge: 2020-07-26 | Disposition: A | Discharge status: Home / Self Care | Payer: BC Managed Care – PPO | Attending: Urology | Admitting: Urology

## 2020-07-26 ENCOUNTER — Other Ambulatory Visit: Payer: Self-pay

## 2020-07-26 ENCOUNTER — Ambulatory Visit (HOSPITAL_COMMUNITY): Payer: BC Managed Care – PPO

## 2020-07-26 ENCOUNTER — Encounter (HOSPITAL_BASED_OUTPATIENT_CLINIC_OR_DEPARTMENT_OTHER): Payer: Self-pay | Admitting: Urology

## 2020-07-26 DIAGNOSIS — Z8249 Family history of ischemic heart disease and other diseases of the circulatory system: Secondary | ICD-10-CM | POA: Diagnosis not present

## 2020-07-26 DIAGNOSIS — I10 Essential (primary) hypertension: Secondary | ICD-10-CM | POA: Diagnosis not present

## 2020-07-26 DIAGNOSIS — N200 Calculus of kidney: Secondary | ICD-10-CM | POA: Insufficient documentation

## 2020-07-26 DIAGNOSIS — N201 Calculus of ureter: Secondary | ICD-10-CM

## 2020-07-26 DIAGNOSIS — Z885 Allergy status to narcotic agent status: Secondary | ICD-10-CM | POA: Insufficient documentation

## 2020-07-26 DIAGNOSIS — K589 Irritable bowel syndrome without diarrhea: Secondary | ICD-10-CM | POA: Insufficient documentation

## 2020-07-26 DIAGNOSIS — H9190 Unspecified hearing loss, unspecified ear: Secondary | ICD-10-CM | POA: Insufficient documentation

## 2020-07-26 HISTORY — PX: EXTRACORPOREAL SHOCK WAVE LITHOTRIPSY: SHX1557

## 2020-07-26 SURGERY — LITHOTRIPSY, ESWL
Anesthesia: LOCAL | Laterality: Right

## 2020-07-26 MED ORDER — DIAZEPAM 5 MG PO TABS: 10.0000 mg | ORAL_TABLET | ORAL | Status: DC

## 2020-07-26 MED ORDER — SODIUM CHLORIDE 0.9 % IV SOLN: INTRAVENOUS | Status: DC

## 2020-07-26 MED ORDER — CIPROFLOXACIN HCL 500 MG PO TABS
500.0000 mg | ORAL_TABLET | ORAL | Status: AC
  Administered 2020-07-26: 500 mg via ORAL

## 2020-07-26 MED ORDER — KETOROLAC TROMETHAMINE 30 MG/ML IJ SOLN
INTRAMUSCULAR | Status: AC
  Filled 2020-07-26: qty 1

## 2020-07-26 MED ORDER — DIAZEPAM 5 MG PO TABS
10.0000 mg | ORAL_TABLET | ORAL | Status: AC
  Administered 2020-07-26: 10 mg via ORAL

## 2020-07-26 MED ORDER — ONDANSETRON HCL 4 MG/2ML IJ SOLN
4.0000 mg | Freq: Once | INTRAMUSCULAR | Status: AC
  Administered 2020-07-26: 4 mg via INTRAVENOUS

## 2020-07-26 MED ORDER — DIPHENHYDRAMINE HCL 25 MG PO CAPS
25.0000 mg | ORAL_CAPSULE | ORAL | Status: AC
  Administered 2020-07-26: 25 mg via ORAL

## 2020-07-26 MED ORDER — DIPHENHYDRAMINE HCL 25 MG PO CAPS: 25.0000 mg | ORAL_CAPSULE | ORAL | Status: DC

## 2020-07-26 MED ORDER — CIPROFLOXACIN HCL 500 MG PO TABS: 500.0000 mg | ORAL_TABLET | ORAL | Status: DC

## 2020-07-26 MED ORDER — DIPHENHYDRAMINE HCL 25 MG PO CAPS
ORAL_CAPSULE | ORAL | Status: AC
  Filled 2020-07-26: qty 1

## 2020-07-26 MED ORDER — CIPROFLOXACIN HCL 500 MG PO TABS
ORAL_TABLET | ORAL | Status: AC
  Filled 2020-07-26: qty 1

## 2020-07-26 MED ORDER — DIAZEPAM 5 MG PO TABS
ORAL_TABLET | ORAL | Status: AC
  Filled 2020-07-26: qty 2

## 2020-07-26 MED ORDER — KETOROLAC TROMETHAMINE 60 MG/2ML IM SOLN
60.0000 mg | Freq: Once | INTRAMUSCULAR | Status: AC
  Administered 2020-07-26: 60 mg via INTRAMUSCULAR
  Filled 2020-07-26: qty 2

## 2020-07-26 MED ORDER — ONDANSETRON HCL 4 MG/2ML IJ SOLN
INTRAMUSCULAR | Status: AC
  Filled 2020-07-26: qty 2

## 2020-07-26 NOTE — Discharge Instructions (Signed)
See [facility] discharge instructions in chart.    Post Anesthesia Home Care Instructions  Activity: Get plenty of rest for the remainder of the day. A responsible individual must stay with you for 24 hours following the procedure.  For the next 24 hours, DO NOT: -Drive a car -Operate machinery -Drink alcoholic beverages -Take any medication unless instructed by your physician -Make any legal decisions or sign important papers.  Meals: Start with liquid foods such as gelatin or soup. Progress to regular foods as tolerated. Avoid greasy, spicy, heavy foods. If nausea and/or vomiting occur, drink only clear liquids until the nausea and/or vomiting subsides. Call your physician if vomiting continues.      

## 2020-07-26 NOTE — Interval H&P Note (Signed)
History and Physical Interval Note:  07/26/2020 8:38 AM  Gary [facility]  has presented today for surgery, with the diagnosis of RIGHT URETERAL STONE.  The various methods of treatment have been discussed with the patient and family. After consideration of risks, benefits and other options for treatment, the patient has consented to  Procedure(s): EXTRACORPOREAL SHOCK WAVE LITHOTRIPSY (ESWL) (Right) as a surgical intervention.  The patient's history has been reviewed, patient examined, no change in status, stable for surgery.  I have reviewed the patient's chart and labs.  Questions were answered to the patient's satisfaction.     Bertram Millard Fredi Hurtado

## 2020-07-27 ENCOUNTER — Encounter (HOSPITAL_BASED_OUTPATIENT_CLINIC_OR_DEPARTMENT_OTHER): Payer: Self-pay | Admitting: Urology

## 2020-09-01 ENCOUNTER — Encounter: Payer: Self-pay | Admitting: Orthopaedic Surgery

## 2020-09-01 ENCOUNTER — Ambulatory Visit: Payer: BC Managed Care – PPO | Admitting: Orthopaedic Surgery

## 2020-09-01 ENCOUNTER — Other Ambulatory Visit: Payer: Self-pay

## 2020-09-01 VITALS — Ht 69.5 in | Wt 201.0 lb

## 2020-09-01 DIAGNOSIS — G8929 Other chronic pain: Secondary | ICD-10-CM

## 2020-09-01 DIAGNOSIS — M25512 Pain in left shoulder: Secondary | ICD-10-CM | POA: Diagnosis not present

## 2020-09-01 MED ORDER — BUPIVACAINE HCL 0.5 % IJ SOLN
2.0000 mL | INTRAMUSCULAR | Status: AC | PRN
  Administered 2020-09-01: 2 mL via INTRA_ARTICULAR

## 2020-09-01 MED ORDER — LIDOCAINE HCL 2 % IJ SOLN
2.0000 mL | INTRAMUSCULAR | Status: AC | PRN
  Administered 2020-09-01: 2 mL

## 2020-09-01 NOTE — Progress Notes (Signed)
Office Visit Note   Patient: Gary [facility]           Date of Birth: 1966/02/20           MRN: [id number] Visit Date: 09/01/2020              Requested by: Ronnald Collum [facility]  [address] [place],  [place] [address] PCP: Coralee Rud, PA-C   Assessment & Plan: Visit Diagnoses:  1. Chronic left shoulder pain     Plan: Chronic, recurrent left shoulder pain. MRI scan performed in May of this year demonstrating a partial thickness partial width tear of the distal supraspinatus tendon with mild AC joint arthritis. Had a prior acromioplasty over 20 years ago. Long head of the biceps tendon was intact as well as the teres minor and subscapularis as well as the infraspinatus. The supraspinatus distal tendon tear was intra-articular measuring 8 to 10 mm but no evidence of retracted tear or atrophy. He like to try another cortisone injection and then consider arthroscopic SCD DCR and probably a mini open rotator cuff tear repair sometime after the first of the year if he continues to have a problem  Follow-Up Instructions: Return if symptoms worsen or fail to improve.   Orders:  No orders of the defined types were placed in this encounter.  No orders of the defined types were placed in this encounter.     Procedures: Large Joint Inj: L glenohumeral on 09/01/2020 11:29 AM Indications: pain and diagnostic evaluation Details: 25 G 1.5 in needle, anterolateral approach  Arthrogram: No  Medications: 2 mL lidocaine 2 %; 2 mL bupivacaine 0.5 %  2 mL of betamethasone Consent was given by the patient. Immediately prior to procedure a time out was called to verify the correct patient, procedure, equipment, support staff and site/side marked as required. Patient was prepped and draped in the usual sterile fashion.       Clinical Data: No additional findings.   Subjective: Chief Complaint  Patient presents with  . Left Shoulder - Follow-up, Pain    Patient presents today for recurrent chronic left shoulder pain. He last received a cortisone injection on 02/17/2020. He states that the injection helped, but started hurting again two months ago. He is wanting to get another injection today. He does not like to take anything for pain. He is left hand dominant.   HPI  Review of Systems   Objective: Vital Signs: Ht 5' 9.5" (1.765 m)   Wt 201 lb (91.2 kg)   BMI 29.26 kg/m   Physical Exam Constitutional:      Appearance: He is well-developed.  Eyes:     Pupils: Pupils are equal, round, and reactive to light.  Pulmonary:     Effort: Pulmonary effort is normal.  Skin:    General: Skin is warm and dry.  Neurological:     Mental Status: He is alert and oriented to person, place, and time.  Psychiatric:        Behavior: Behavior normal.     Ortho Exam awake alert and oriented x3. Comfortable sitting. Able to place his arm over his head but with acute circuitous arc of motion. Positive empty can and impingement testing. Good grip and good release. Skin intact. Biceps intact Specialty Comments:  No specialty comments available.  Imaging: No results found.   PMFS History: Patient Active Problem List   Diagnosis Date Noted  . Pain in left shoulder 06/24/2019  .  Low back pain 06/24/2019  . Classic migraine with aura 11/11/2013  . Dizziness and giddiness 11/11/2013  . Coital headache 11/11/2013   Past Medical History:  Diagnosis Date  . Classic migraine with aura 11/11/2013  . 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 Dizziness   . Headache(784.0)   . Hearing loss    asymmetrical  . History of kidney stones   . Hypertension   . Irritable bowel syndrome   . PONV (postoperative nausea and vomiting)   . Renal calculi    Lithotripsy  . Testosterone deficiency     Family History  Problem Relation Age of Onset  . High blood pressure Mother   . Hypertension Mother   . High blood pressure Father   . Cancer - Prostate Father   . Skin  cancer Father   . Hypertension Father   . Hypertension Brother   . Diabetes Brother     Past Surgical History:  Procedure Laterality Date  . ANTERIOR CRUCIATE LIGAMENT REPAIR     x2  . EXTRACORPOREAL SHOCK WAVE LITHOTRIPSY Left 03/05/2017   Procedure: LEFT EXTRACORPOREAL SHOCK WAVE LITHOTRIPSY (ESWL);  Surgeon: Jerilee Field, MD;  [facility];  Service: Urology;  Laterality: Left;  . EXTRACORPOREAL SHOCK WAVE LITHOTRIPSY Right 11/26/2017   Procedure: RIGHT EXTRACORPOREAL SHOCK WAVE LITHOTRIPSY (ESWL);  Surgeon: Rene Paci, MD;  [facility];  Service: Urology;  Laterality: Right;  . EXTRACORPOREAL SHOCK WAVE LITHOTRIPSY Left 12/05/2018   Procedure: EXTRACORPOREAL SHOCK WAVE LITHOTRIPSY (ESWL);  Surgeon: Ihor Gully, MD;  [facility];  Service: Urology;  Laterality: Left;  . EXTRACORPOREAL SHOCK WAVE LITHOTRIPSY Right 07/26/2020   Procedure: EXTRACORPOREAL SHOCK WAVE LITHOTRIPSY (ESWL);  Surgeon: Marcine Matar, MD;  Location: [facility];  Service: Urology;  Laterality: Right;  . EYE SURGERY     lasik surgery bilat   . HIP SURGERY Right   . KNEE SURGERY Left    x2 left knee  . NERVE, TENDON AND ARTERY REPAIR Right 10/14/2019   Procedure: WOUND EXPLORATION REPAIR EXTENSOR TENDON RIGHT THUMB WITH POSSIBLE EXTENSOR INDICUS PROPRIUS TO EXTENSOR POLICUS LONGUS TRANSFER;  Surgeon: Betha Loa, MD;  Location: [facility];  Service: Orthopedics;  Laterality: Right;  . SHOULDER SURGERY     Bilateral  . Thumb surgery     Tendon repair, right   Social History   Occupational History    Comment: [employer]  Tobacco Use  . Smoking status: Never Smoker  . Smokeless tobacco: Never Used  Vaping Use  . Vaping Use: Never used  Substance and Sexual Activity  . Alcohol use: No  . Drug use: Not Currently  . Sexual activity: Not on file

## 2020-10-18 ENCOUNTER — Telehealth: Payer: Self-pay | Admitting: Orthopaedic Surgery

## 2020-10-18 NOTE — Telephone Encounter (Signed)
Surgery sheet to fill out has been placed in your "to do" folder. Thanks!

## 2020-10-18 NOTE — Telephone Encounter (Signed)
Patient left message on voice mail stating he would like to schedule shoulder surgery.  I returned patient's call and left message providing my name and direct number.   Please provide surgery sheet.

## 2020-10-19 NOTE — Telephone Encounter (Signed)
thanks

## 2020-11-25 ENCOUNTER — Other Ambulatory Visit: Payer: Self-pay | Admitting: Orthopaedic Surgery

## 2020-11-25 DIAGNOSIS — S46022A Laceration of muscle(s) and tendon(s) of the rotator cuff of left shoulder, initial encounter: Secondary | ICD-10-CM | POA: Diagnosis not present

## 2020-11-25 DIAGNOSIS — M19012 Primary osteoarthritis, left shoulder: Secondary | ICD-10-CM | POA: Diagnosis not present

## 2020-11-25 DIAGNOSIS — M7542 Impingement syndrome of left shoulder: Secondary | ICD-10-CM | POA: Diagnosis not present

## 2020-11-25 MED ORDER — HYDROMORPHONE HCL 2 MG PO TABS: 2.0000 mg | ORAL_TABLET | ORAL | 0 refills | Status: AC | PRN

## 2020-11-29 ENCOUNTER — Telehealth: Payer: Self-pay

## 2020-11-29 NOTE — Telephone Encounter (Signed)
Called and left a VM for patients wife to call back. 

## 2020-12-02 ENCOUNTER — Encounter: Payer: Self-pay | Admitting: Orthopaedic Surgery

## 2020-12-02 ENCOUNTER — Ambulatory Visit (INDEPENDENT_AMBULATORY_CARE_PROVIDER_SITE_OTHER): Payer: BC Managed Care – PPO | Admitting: Orthopaedic Surgery

## 2020-12-02 ENCOUNTER — Other Ambulatory Visit: Payer: Self-pay

## 2020-12-02 VITALS — Ht 69.5 in | Wt 201.0 lb

## 2020-12-02 DIAGNOSIS — G8929 Other chronic pain: Secondary | ICD-10-CM

## 2020-12-02 DIAGNOSIS — M25512 Pain in left shoulder: Secondary | ICD-10-CM

## 2020-12-02 NOTE — Progress Notes (Signed)
Office Visit Note   Patient: Gary [facility]           Date of Birth: [birth year]           MRN: [id number] Visit Date: 12/02/2020              Requested by: Ronnald Collum [facility]  [address] [place],  [place] [address] PCP: Coralee Rud, PA-C   Assessment & Plan: Visit Diagnoses:  1. Chronic left shoulder pain     Plan: 1 week status post rotator cuff tear repair of left shoulder there was a slight retraction of the supraspinatus which was reattached to its anatomic position.  Doing well.  Wounds are healing without a problem.  Continue with the sling begin passive range of motion and physical therapy in office 2 weeks  Follow-Up Instructions: Return in about 2 weeks (around 12/16/2020).   Orders:  Orders Placed This Encounter  Procedures  . Ambulatory referral to Physical Therapy   No orders of the defined types were placed in this encounter.     Procedures: No procedures performed   Clinical Data: No additional findings.   Subjective: Chief Complaint  Patient presents with  . Left Shoulder - Follow-up    Left shoulder arthroscopy 11/25/2020  Patient presents today for follow up on his left shoulder. He had a left shoulder rotator cuff tear repair on 11/25/2020. He is now one week out from surgery. Patient states that he has been experiencing quite a bit of pain. He cannot take narcotics because they cause a lot of itching. He has taken some tylenol but states that it does not really help. Today is the first day that he has actually felt a little better.   HPI  Review of Systems   Objective: Vital Signs: Ht 5' 9.5" (1.765 m)   Wt 201 lb (91.2 kg)   BMI 29.26 kg/m   Physical Exam  Ortho Exam left shoulder incisions healing without problem.  Wounds were cleaned and new Steri-Strips applied.  Neurologically intact no evidence of infection  Specialty Comments:  No specialty comments available.  Imaging: No results  found.   PMFS History: Patient Active Problem List   Diagnosis Date Noted  . Pain in left shoulder 06/24/2019  . Low back pain 06/24/2019  . Classic migraine with aura 11/11/2013  . Dizziness and giddiness 11/11/2013  . Coital headache 11/11/2013   Past Medical History:  Diagnosis Date  . Classic migraine with aura 11/11/2013  . 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 Dizziness   . Headache(784.0)   . Hearing loss    asymmetrical  . History of kidney stones   . Hypertension   . Irritable bowel syndrome   . PONV (postoperative nausea and vomiting)   . Renal calculi    Lithotripsy  . Testosterone deficiency     Family History  Problem Relation Age of Onset  . High blood pressure Mother   . Hypertension Mother   . High blood pressure Father   . Cancer - Prostate Father   . Skin cancer Father   . Hypertension Father   . Hypertension Brother   . Diabetes Brother     Past Surgical History:  Procedure Laterality Date  . ANTERIOR CRUCIATE LIGAMENT REPAIR     x2  . EXTRACORPOREAL SHOCK WAVE LITHOTRIPSY Left 03/05/2017   Procedure: LEFT EXTRACORPOREAL SHOCK WAVE LITHOTRIPSY (ESWL);  Surgeon: Jerilee Field, MD;  [facility];  Service: Urology;  Laterality: Left;  . EXTRACORPOREAL SHOCK WAVE LITHOTRIPSY Right 11/26/2017   Procedure: RIGHT EXTRACORPOREAL SHOCK WAVE LITHOTRIPSY (ESWL);  Surgeon: Rene Paci, MD;  [facility];  Service: Urology;  Laterality: Right;  . EXTRACORPOREAL SHOCK WAVE LITHOTRIPSY Left 12/05/2018   Procedure: EXTRACORPOREAL SHOCK WAVE LITHOTRIPSY (ESWL);  Surgeon: Ihor Gully, MD;  [facility];  Service: Urology;  Laterality: Left;  . EXTRACORPOREAL SHOCK WAVE LITHOTRIPSY Right 07/26/2020   Procedure: EXTRACORPOREAL SHOCK WAVE LITHOTRIPSY (ESWL);  Surgeon: Marcine Matar, MD;  Location: [facility];  Service: Urology;  Laterality: Right;  . EYE SURGERY     lasik surgery bilat   . HIP SURGERY Right   . KNEE SURGERY  Left    x2 left knee  . NERVE, TENDON AND ARTERY REPAIR Right 10/14/2019   Procedure: WOUND EXPLORATION REPAIR EXTENSOR TENDON RIGHT THUMB WITH POSSIBLE EXTENSOR INDICUS PROPRIUS TO EXTENSOR POLICUS LONGUS TRANSFER;  Surgeon: Betha Loa, MD;  Location: [facility];  Service: Orthopedics;  Laterality: Right;  . SHOULDER SURGERY     Bilateral  . Thumb surgery     Tendon repair, right   Social History   Occupational History    Comment: [employer]  Tobacco Use  . Smoking status: Never Smoker  . Smokeless tobacco: Never Used  Vaping Use  . Vaping Use: Never used  Substance and Sexual Activity  . Alcohol use: No  . Drug use: Not Currently  . Sexual activity: Not on file

## 2020-12-06 ENCOUNTER — Ambulatory Visit (INDEPENDENT_AMBULATORY_CARE_PROVIDER_SITE_OTHER): Payer: BC Managed Care – PPO | Admitting: Physical Therapy

## 2020-12-06 ENCOUNTER — Encounter: Payer: Self-pay | Admitting: Physical Therapy

## 2020-12-06 ENCOUNTER — Other Ambulatory Visit: Payer: Self-pay

## 2020-12-06 DIAGNOSIS — M25512 Pain in left shoulder: Secondary | ICD-10-CM

## 2020-12-06 DIAGNOSIS — M25612 Stiffness of left shoulder, not elsewhere classified: Secondary | ICD-10-CM | POA: Diagnosis not present

## 2020-12-06 DIAGNOSIS — R600 Localized edema: Secondary | ICD-10-CM

## 2020-12-06 NOTE — Therapy (Signed)
[facility] [address] [place], [place], [address] Phone: [phone]   Fax:  [phone]  Physical Therapy Evaluation  Patient Details  Name: Gary Fitzgerald MRN: [id number] Date of Birth: 1966-03-20 Referring Provider (PT): Norlene CampbellWhitfield, Peter, MD   Encounter Date: 12/06/2020   PT End of Session - 12/06/20 1627    Visit Number 1    Number of Visits 18    Date for PT Re-Evaluation 01/31/21    PT Start Time 1430    PT Stop Time 1515    PT Time Calculation (min) 45 min    Activity Tolerance Patient tolerated treatment well    Behavior [facility] tasks assessed/performed           Past Medical History:  Diagnosis Date  . Classic migraine with aura 11/11/2013  . 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 Dizziness   . Headache(784.0)   . Hearing loss    asymmetrical  . History of kidney stones   . Hypertension   . Irritable bowel syndrome   . PONV (postoperative nausea and vomiting)   . Renal calculi    Lithotripsy  . Testosterone deficiency     Past Surgical History:  Procedure Laterality Date  . ANTERIOR CRUCIATE LIGAMENT REPAIR     x2  . EXTRACORPOREAL SHOCK WAVE LITHOTRIPSY Left 03/05/2017   Procedure: LEFT EXTRACORPOREAL SHOCK WAVE LITHOTRIPSY (ESWL);  Surgeon: Jerilee FieldEskridge, Matthew, MD;  Location: [facility];  Service: Urology;  Laterality: Left;  . EXTRACORPOREAL SHOCK WAVE LITHOTRIPSY Right 11/26/2017   Procedure: RIGHT EXTRACORPOREAL SHOCK WAVE LITHOTRIPSY (ESWL);  Surgeon: Rene PaciWinter, Christopher Aaron, MD;  Location: [facility];  Service: Urology;  Laterality: Right;  . EXTRACORPOREAL SHOCK WAVE LITHOTRIPSY Left 12/05/2018   Procedure: EXTRACORPOREAL SHOCK WAVE LITHOTRIPSY (ESWL);  Surgeon: Ihor Gullyttelin, Mark, MD;  Location: [facility];  Service: Urology;  Laterality: Left;  . EXTRACORPOREAL SHOCK WAVE LITHOTRIPSY Right 07/26/2020   Procedure: EXTRACORPOREAL SHOCK WAVE LITHOTRIPSY (ESWL);  Surgeon: Marcine Matarahlstedt, Stephen, MD;  [facility];  Service: Urology;  Laterality: Right;  . EYE SURGERY     lasik surgery bilat   . HIP SURGERY Right   . KNEE SURGERY Left    x2 left knee  . NERVE, TENDON AND ARTERY REPAIR Right 10/14/2019   Procedure: WOUND EXPLORATION REPAIR EXTENSOR TENDON RIGHT THUMB WITH POSSIBLE EXTENSOR INDICUS PROPRIUS TO EXTENSOR POLICUS LONGUS TRANSFER;  Surgeon: Betha LoaKuzma, Kevin, MD;  Location: [facility];  Service: Orthopedics;  Laterality: Right;  . SHOULDER SURGERY     Bilateral  . Thumb surgery     Tendon repair, right    There were no vitals filed for this visit.    Subjective Assessment - 12/06/20 1439    Subjective Pt s/p L rotator cuff repair on 11/25/2020. Pt arriving to therapy reporting no pain in L shoulder at rest. Pt wearing sling. Pt states that Dr. Cleophas DunkerWhitfield gave him pendulums to do at home and he states that he is able to perform small circles.    Pertinent History s/p rotator cuff repair  on 11/25/2020, with reattached supraspinatus tendon, AC joint arthritis, LBP, migraines, HTN, IBS    Limitations Lifting;House hold activities;Other (comment)    How long can you sit comfortably? unlimited    How long can you stand comfortably? unlimited    How long can you walk comfortably? unlimited    Diagnostic tests X-ray    Patient Stated Goals Get back to work without pain    Currently in  Pain? No/denies    Pain Score 0-No pain   no pain at rest, pain can vary with activity levels   Pain Location Shoulder    Pain Orientation Left    Pain Descriptors / Indicators Sore    Pain Type Surgical pain    Pain Onset 1 to 4 weeks ago    Pain Frequency Intermittent    Aggravating Factors  movements    Pain Relieving Factors resting, over the counter pain meds    Effect of Pain on Daily Activities unable to perform ADL's and work related functions due to sling and precautions              [facility] PT Assessment - 12/06/20 0001      Assessment   Medical Diagnosis L shoulder  arthroscopy with supraspinatus repair, Q24.497    Referring Provider (PT) Norlene Campbell, MD    Onset Date/Surgical Date 11/25/20    Hand Dominance Left    Next MD Visit 2 week f/u    Prior Therapy yes, last year for thumb tendon tear      Precautions   Precaution Comments PROM  x 6 weeks ( until 12/22/2020)      Restrictions   Weight Bearing Restrictions No      Balance Screen   Has the patient fallen in the past 6 months No      Home Environment   Living Environment Private residence    Living Arrangements Spouse/significant other    Type of Home House    Home Access Stairs to enter    Entrance Stairs-Number of Steps 8    Entrance Stairs-Rails Right    Home Layout Multi-level    Alternate Level Stairs-Number of Steps 13      Prior Function   Level of Independence Independent    Vocation Full time employment    Leisure riding ATV's, shooting guns,      Cognition   Overall Cognitive Status Within Functional Limits for tasks assessed      Observation/Other Assessments   Focus on Therapeutic Outcomes (FOTO)  intake: 30, predicted: 61      Posture/Postural Control   Posture/Postural Control Postural limitations    Postural Limitations Rounded Shoulders;Forward head      ROM / Strength   AROM / PROM / Strength AROM;PROM;Strength      AROM   Overall AROM  Deficits    AROM Assessment Site Shoulder    Right/Left Shoulder Right;Left    Right Shoulder Extension 55 Degrees    Right Shoulder Flexion 160 Degrees    Right Shoulder ABduction 172 Degrees    Right Shoulder Internal Rotation --   thumb to T6   Right Shoulder External Rotation 74 Degrees    Left Shoulder Extension 30 Degrees    Left Shoulder Flexion 28 Degrees    Left Shoulder ABduction 24 Degrees    Left Shoulder Internal Rotation --   thumb to left gluteal   Left Shoulder External Rotation 15 Degrees      PROM   Overall PROM  Deficits    PROM Assessment Site Shoulder    Right/Left Shoulder Left    Left  Shoulder Extension 35 Degrees    Left Shoulder Flexion 75 Degrees    Left Shoulder ABduction 60 Degrees    Left Shoulder External Rotation 18 Degrees      Strength   Overall Strength Comments right grip: 135 ppsi, left grip : 107.5 ppsi      Palpation  Palpation comment TTP over incision site and anterior shoulder over supraspinatus tendon                      Objective measurements completed on examination: See above findings.               PT Education - 12/06/20 1643    Education Details HEP, PT POC, basic anatomy    Person(s) Educated Patient    Methods Explanation;Demonstration;Handout;Verbal cues;Tactile cues    Comprehension Verbalized understanding;Returned demonstration            PT Short Term Goals - 12/06/20 1628      PT SHORT TERM GOAL #1   Title Pt will be independent in his HEP.    Time 4    Period Weeks    Status New      PT SHORT TERM GOAL #2   Title Pt will be able to tolreate left shoulder flexion >/= 90 degrees passively.    Baseline 75 degrees on 12/06/2020    Time 4    Period Weeks    Status New    Target Date 01/07/21             PT Long Term Goals - 12/06/20 1629      PT LONG TERM GOAL #1   Title Pt will be albe to improve his L shoulder flexion to >/= 150 degrees.    Baseline see flowsheets    Time 8    Period Weeks    Target Date 02/04/21      PT LONG TERM GOAL #2   Title Pt will improve his left shoulder External Rotation to >/= 70 degrees.    Baseline see flow sheets    Time 8    Period Weeks    Status New    Target Date 02/04/21      PT LONG TERM GOAL #3   Title Pt will be able to improve his Left grip strength to >/= 130 ppsi.    Baseline L grip strength 107 ppsi on 12/06/2020    Time 8    Period Weeks    Target Date 02/04/21      PT LONG TERM GOAL #4   Title Pt will improve his FOTO score to >/= 61.    Baseline FOTO score of 30 on 12/06/2020    Time 8    Period Weeks    Status New     Target Date 02/04/21                  Plan - 12/06/20 1643    Clinical Impression Statement Pt presenting today s/p left rotator cuff repair of supraspinatus tendon on 11/25/2020. MD order for only PROM for 6 weeks which will be 01/06/2021. Pt presenting with his left arm in sling and reporting no pain at rest. Pt reporting increased pain with certain activities which varies. Pt with tenderness over supraspinatus  tendon and incision site. Pt presenting with limitations in active and passive ROM and strength. Pt works in [employer] and his goals are return to work. Skilled PT needed to address pt's impairments with the below interventions.    Personal Factors and Comorbidities Comorbidity 2    Comorbidities HTN, migraines, IBS, LBP    Examination-Activity Limitations Dressing;Sleep;Lift;Other    Examination-Participation Restrictions Cleaning;Community Activity;Other;Yard Work    Stability/Clinical Decision Making Stable/Uncomplicated    Clinical Decision Making Low    Rehab Potential Good    PT  Frequency Other (comment)   2-3 x/ week for first few weeks progresing to 2x/ week   PT Duration 8 weeks    PT Treatment/Interventions ADLs/Self Care Home Management;Cryotherapy;Electrical Stimulation;Therapeutic activities;Functional mobility training;Therapeutic exercise;Neuromuscular re-education;Patient/family education;Manual techniques;Passive range of motion;Vasopneumatic Device;Taping;Ultrasound    PT Next Visit Plan PROM for 6 weeks post op ( 01/06/2021), posture correction, grip strength, modalities as needed    PT Home Exercise Plan Access Code: [email or web address]    Consulted and Agree with Plan of Care Patient           Patient will benefit from skilled therapeutic intervention in order to improve the following deficits and impairments:  Pain,Impaired UE functional use,Postural dysfunction,Impaired flexibility,Increased edema,Decreased range of motion,Decreased strength  Visit  Diagnosis: Acute pain of left shoulder  Stiffness of left shoulder, not elsewhere classified  Localized edema     Problem List Patient Active Problem List   Diagnosis Date Noted  . Pain in left shoulder 06/24/2019  . Low back pain 06/24/2019  . Classic migraine with aura 11/11/2013  . Dizziness and giddiness 11/11/2013  . Coital headache 11/11/2013    Sharmon Leyden, PT, MPT 12/06/2020, 4:51 PM  [facility] [address] [place], [place], [address] Phone: [phone]   Fax:  [phone]  Name: Gary Fitzgerald MRN: [id number] Date of Birth: 1966/05/24

## 2020-12-06 NOTE — Patient Instructions (Signed)
Access Code: [email or web address] URL: [email or web address] Date: 12/06/2020 Prepared by: Narda Amber  Exercises Seated Shoulder Flexion Towel Slide at Table Top - 2-3 x daily - 7 x weekly - 2 sets - 10 reps Seated Shoulder Scaption Slide at Table Top with Forearm in Neutral - 2-3 x daily - 7 x weekly - 2 sets - 10 reps Seated Shoulder External Rotation PROM on Table - 2-3 x daily - 7 x weekly - 2 sets - 10 reps Circular Shoulder Pendulum with Table Support - 2-3 x daily - 7 x weekly - 3 sets - 10 reps Flexion-Extension Shoulder Pendulum with Table Support - 2-3 x daily - 7 x weekly - 3 sets - 10 reps

## 2020-12-08 ENCOUNTER — Other Ambulatory Visit: Payer: Self-pay

## 2020-12-08 ENCOUNTER — Ambulatory Visit (INDEPENDENT_AMBULATORY_CARE_PROVIDER_SITE_OTHER): Payer: BC Managed Care – PPO | Admitting: Physical Therapy

## 2020-12-08 DIAGNOSIS — M25612 Stiffness of left shoulder, not elsewhere classified: Secondary | ICD-10-CM

## 2020-12-08 DIAGNOSIS — M25512 Pain in left shoulder: Secondary | ICD-10-CM

## 2020-12-08 DIAGNOSIS — R600 Localized edema: Secondary | ICD-10-CM

## 2020-12-08 NOTE — Therapy (Signed)
[facility] Therapy [address] [place], [place], [address] Phone: [phone]   Fax:  [phone]  Physical Therapy Treatment  Patient Details  Name: Gary Fitzgerald MRN: [id number] Date of Birth: 02-04-1966 Referring Provider (PT): Norlene Campbell, MD   Encounter Date: 12/08/2020   PT End of Session - 12/08/20 0859    Visit Number 2    Number of Visits 18    PT Start Time 0852    PT Stop Time 0930    PT Time Calculation (min) 38 min    Activity Tolerance Patient tolerated treatment well    Behavior During Therapy [facility] for tasks assessed/performed           Past Medical History:  Diagnosis Date  . Classic migraine with aura 11/11/2013  . 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 Dizziness   . Headache(784.0)   . Hearing loss    asymmetrical  . History of kidney stones   . Hypertension   . Irritable bowel syndrome   . PONV (postoperative nausea and vomiting)   . Renal calculi    Lithotripsy  . Testosterone deficiency     Past Surgical History:  Procedure Laterality Date  . ANTERIOR CRUCIATE LIGAMENT REPAIR     x2  . EXTRACORPOREAL SHOCK WAVE LITHOTRIPSY Left 03/05/2017   Procedure: LEFT EXTRACORPOREAL SHOCK WAVE LITHOTRIPSY (ESWL);  Surgeon: Jerilee Field, MD;  Location: [facility];  Service: Urology;  Laterality: Left;  . EXTRACORPOREAL SHOCK WAVE LITHOTRIPSY Right 11/26/2017   Procedure: RIGHT EXTRACORPOREAL SHOCK WAVE LITHOTRIPSY (ESWL);  Surgeon: Rene Paci, MD;  Location: [facility];  Service: Urology;  Laterality: Right;  . EXTRACORPOREAL SHOCK WAVE LITHOTRIPSY Left 12/05/2018   Procedure: EXTRACORPOREAL SHOCK WAVE LITHOTRIPSY (ESWL);  Surgeon: Ihor Gully, MD;  Location: [facility];  Service: Urology;  Laterality: Left;  . EXTRACORPOREAL SHOCK WAVE LITHOTRIPSY Right 07/26/2020   Procedure: EXTRACORPOREAL SHOCK WAVE LITHOTRIPSY (ESWL);  Surgeon: Marcine Matar, MD;  Location: [facility];  Service: Urology;   Laterality: Right;  . EYE SURGERY     lasik surgery bilat   . HIP SURGERY Right   . KNEE SURGERY Left    x2 left knee  . NERVE, TENDON AND ARTERY REPAIR Right 10/14/2019   Procedure: WOUND EXPLORATION REPAIR EXTENSOR TENDON RIGHT THUMB WITH POSSIBLE EXTENSOR INDICUS PROPRIUS TO EXTENSOR POLICUS LONGUS TRANSFER;  Surgeon: Betha Loa, MD;  Location: [facility];  Service: Orthopedics;  Laterality: Right;  . SHOULDER SURGERY     Bilateral  . Thumb surgery     Tendon repair, right    There were no vitals filed for this visit.   Subjective Assessment - 12/08/20 0857    Subjective Pt reporting no pain upon arrival. 4/10 pain when performing stretching.    Pertinent History s/p rotator cuff repair  on 11/25/2020, with reattached supraspinatus tendon, AC joint arthritis, LBP, migraines, HTN, IBS    Limitations Lifting;House hold activities;Other (comment)    How long can you sit comfortably? unlimited    How long can you stand comfortably? unlimited    How long can you walk comfortably? unlimited    Diagnostic tests X-ray    Patient Stated Goals Get back to work without pain    Currently in Pain? Yes    Pain Score 4     Pain Location Shoulder    Pain Orientation Left    Pain Descriptors / Indicators Aching;Sore    Pain Type Surgical pain    Pain Onset 1 to 4  weeks ago    Pain Frequency Intermittent                             [facility] Adult PT Treatment/Exercise - 12/08/20 0001      Exercises   Exercises Shoulder      Shoulder Exercises: Stretch   Table Stretch -Flexion Limitations 15 reps holding 5 seconds each    Table Stretch - External Rotation Limitations 15 reps holding 5 seconds each    Other Shoulder Stretches table stretch: scaption 15 reps holding 5 seocnds each      Modalities   Modalities Vasopneumatic      Vasopneumatic   Number Minutes Vasopneumatic  10 minutes    Vasopnuematic Location  Shoulder    Vasopneumatic Pressure Low     Vasopneumatic Temperature  34      Manual Therapy   Manual therapy comments PROM: L shoulder all planes, gentle mobs grade 2-3 as pt tolerates                    PT Short Term Goals - 12/06/20 1628      PT SHORT TERM GOAL #1   Title Pt will be independent in his HEP.    Time 4    Period Weeks    Status New      PT SHORT TERM GOAL #2   Title Pt will be able to tolreate left shoulder flexion >/= 90 degrees passively.    Baseline 75 degrees on 12/06/2020    Time 4    Period Weeks    Status New    Target Date 01/07/21             PT Long Term Goals - 12/06/20 1629      PT LONG TERM GOAL #1   Title Pt will be albe to improve his L shoulder flexion to >/= 150 degrees.    Baseline see flowsheets    Time 8    Period Weeks    Target Date 02/04/21      PT LONG TERM GOAL #2   Title Pt will improve his left shoulder External Rotation to >/= 70 degrees.    Baseline see flow sheets    Time 8    Period Weeks    Status New    Target Date 02/04/21      PT LONG TERM GOAL #3   Title Pt will be able to improve his Left grip strength to >/= 130 ppsi.    Baseline L grip strength 107 ppsi on 12/06/2020    Time 8    Period Weeks    Target Date 02/04/21      PT LONG TERM GOAL #4   Title Pt will improve his FOTO score to >/= 61.    Baseline FOTO score of 30 on 12/06/2020    Time 8    Period Weeks    Status New    Target Date 02/04/21                 Plan - 12/08/20 0903    Clinical Impression Statement Pt arriving today reporting 4/10 pain with strething and HEP. No pain at rest. Pt compliant with wearing sling. Pt tolerating PROM and reporting less pain at end of session following vasopneumatic.    Personal Factors and Comorbidities Comorbidity 2    Comorbidities HTN, migraines, IBS, LBP    Examination-Activity Limitations Dressing;Sleep;Lift;Other  Examination-Participation Restrictions Cleaning;Community Activity;Other;Yard Work    Stability/Clinical  Decision Making Stable/Uncomplicated    Rehab Potential Good    PT Frequency Other (comment)    PT Duration 8 weeks    PT Treatment/Interventions ADLs/Self Care Home Management;Cryotherapy;Electrical Stimulation;Therapeutic activities;Functional mobility training;Therapeutic exercise;Neuromuscular re-education;Patient/family education;Manual techniques;Passive range of motion;Vasopneumatic Device;Taping;Ultrasound    PT Next Visit Plan PROM for 6 weeks post op ( 01/06/2021), posture correction, grip strength, modalities as needed    PT Home Exercise Plan Access Code: [email or web address]    Consulted and Agree with Plan of Care Patient           Patient will benefit from skilled therapeutic intervention in order to improve the following deficits and impairments:  Pain,Impaired UE functional use,Postural dysfunction,Impaired flexibility,Increased edema,Decreased range of motion,Decreased strength  Visit Diagnosis: Acute pain of left shoulder  Stiffness of left shoulder, not elsewhere classified  Localized edema     Problem List Patient Active Problem List   Diagnosis Date Noted  . Pain in left shoulder 06/24/2019  . Low back pain 06/24/2019  . Classic migraine with aura 11/11/2013  . Dizziness and giddiness 11/11/2013  . Coital headache 11/11/2013    Sharmon Leyden, PT, MPT 12/08/2020, 9:30 AM  [facility] Therapy [address] [place], [place], [address] Phone: [phone]   Fax:  [phone]  Name: Gary Fitzgerald MRN: [id number] Date of Birth: 1966/08/02

## 2020-12-14 ENCOUNTER — Encounter: Payer: Self-pay | Admitting: Rehabilitative and Restorative Service Providers"

## 2020-12-14 ENCOUNTER — Other Ambulatory Visit: Payer: Self-pay

## 2020-12-14 ENCOUNTER — Ambulatory Visit: Payer: BC Managed Care – PPO | Admitting: Rehabilitative and Restorative Service Providers"

## 2020-12-14 ENCOUNTER — Ambulatory Visit (INDEPENDENT_AMBULATORY_CARE_PROVIDER_SITE_OTHER): Payer: BC Managed Care – PPO | Admitting: Rehabilitative and Restorative Service Providers"

## 2020-12-14 DIAGNOSIS — M25512 Pain in left shoulder: Secondary | ICD-10-CM

## 2020-12-14 DIAGNOSIS — M25612 Stiffness of left shoulder, not elsewhere classified: Secondary | ICD-10-CM | POA: Diagnosis not present

## 2020-12-14 DIAGNOSIS — R600 Localized edema: Secondary | ICD-10-CM

## 2020-12-14 NOTE — Therapy (Signed)
[facility] Physical Therapy [address] [place], [place], [address] Phone: [phone]   Fax:  [phone]  Physical Therapy Treatment  Patient Details  Name: Gary Fitzgerald MRN: [id number] Date of Birth: July 08, 1966 Referring Provider (PT): Norlene Campbell, MD   Encounter Date: 12/14/2020   PT End of Session - 12/14/20 1227    Visit Number 3    Number of Visits 18    Authorization Type 60 visits/year    PT Start Time 1015    PT Stop Time 1100    PT Time Calculation (min) 45 min    Activity Tolerance Patient tolerated treatment well    Behavior During Therapy [facility] for tasks assessed/performed           Past Medical History:  Diagnosis Date  . Classic migraine with aura 11/11/2013  . 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 Dizziness   . Headache(784.0)   . Hearing loss    asymmetrical  . History of kidney stones   . Hypertension   . Irritable bowel syndrome   . PONV (postoperative nausea and vomiting)   . Renal calculi    Lithotripsy  . Testosterone deficiency     Past Surgical History:  Procedure Laterality Date  . ANTERIOR CRUCIATE LIGAMENT REPAIR     x2  . EXTRACORPOREAL SHOCK WAVE LITHOTRIPSY Left 03/05/2017   Procedure: LEFT EXTRACORPOREAL SHOCK WAVE LITHOTRIPSY (ESWL);  Surgeon: Jerilee Field, MD;  Location: [facility];  Service: Urology;  Laterality: Left;  . EXTRACORPOREAL SHOCK WAVE LITHOTRIPSY Right 11/26/2017   Procedure: RIGHT EXTRACORPOREAL SHOCK WAVE LITHOTRIPSY (ESWL);  Surgeon: Rene Paci, MD;  Location: [facility];  Service: Urology;  Laterality: Right;  . EXTRACORPOREAL SHOCK WAVE LITHOTRIPSY Left 12/05/2018   Procedure: EXTRACORPOREAL SHOCK WAVE LITHOTRIPSY (ESWL);  Surgeon: Ihor Gully, MD;  Location: [facility];  Service: Urology;  Laterality: Left;  . EXTRACORPOREAL SHOCK WAVE LITHOTRIPSY Right 07/26/2020   Procedure: EXTRACORPOREAL SHOCK WAVE LITHOTRIPSY (ESWL);  Surgeon: Marcine Matar, MD;  Location: [facility];  Service: Urology;  Laterality: Right;  . EYE SURGERY     lasik surgery bilat   . HIP SURGERY Right   . KNEE SURGERY Left    x2 left knee  . NERVE, TENDON AND ARTERY REPAIR Right 10/14/2019   Procedure: WOUND EXPLORATION REPAIR EXTENSOR TENDON RIGHT THUMB WITH POSSIBLE EXTENSOR INDICUS PROPRIUS TO EXTENSOR POLICUS LONGUS TRANSFER;  Surgeon: Betha Loa, MD;  Location: [facility];  Service: Orthopedics;  Laterality: Right;  . SHOULDER SURGERY     Bilateral  . Thumb surgery     Tendon repair, right    There were no vitals filed for this visit.   Subjective Assessment - 12/14/20 1224    Subjective Gary Fitzgerald reports sleeping is starting to gradually get better.    Pertinent History s/p rotator cuff repair  on 11/25/2020, with reattached supraspinatus tendon, AC joint arthritis, LBP, migraines, HTN, IBS    Limitations Lifting;House hold activities;Other (comment)    How long can you sit comfortably? unlimited    How long can you stand comfortably? unlimited    How long can you walk comfortably? unlimited    Diagnostic tests X-ray    Patient Stated Goals Get back to work without pain    Currently in Pain? Yes    Pain Score 4     Pain Location Shoulder    Pain Orientation Left    Pain Descriptors / Indicators Aching;Sore    Pain Type Surgical pain;Chronic pain  Pain Onset 1 to 4 weeks ago    Pain Frequency Intermittent    Aggravating Factors  Sleeping, overuse or prolonged rest    Pain Relieving Factors Gentle movement and change of position    Effect of Pain on Daily Activities Affects sleep quality and unable to use L arm for work or ADLs    Multiple Pain Sites No              [facility] PT Assessment - 12/14/20 0001      PROM   Left Shoulder Internal Rotation 45 Degrees    Left Shoulder External Rotation 60 Degrees                         [facility] Adult PT Treatment/Exercise - 12/14/20 0001      Posture/Postural Control    Posture/Postural Control Postural limitations    Postural Limitations Rounded Shoulders;Forward head      Exercises   Exercises Shoulder      Shoulder Exercises: Supine   Protraction AAROM;Strengthening;Left;10 reps;Other (comment)    Protraction Weight (lbs) 0#    Protraction Limitations 3 seconds    Flexion AAROM;Left;10 reps   5 seconds, R side helps     Shoulder Exercises: Standing   Retraction Strengthening;Both;10 reps;Other (comment)    Retraction Limitations 5 seconds    Other Standing Exercises Pendulum F/B; CW; CCW 20X      Manual Therapy   Manual therapy comments PROM L shoulder IR/ER                  PT Education - 12/14/20 1226    Education Details Reviewed shoulder anatomy and HEP    Person(s) Educated Patient    Methods Explanation;Demonstration;Tactile cues;Verbal cues;Handout    Comprehension Verbal cues required;Need further instruction;Returned demonstration;Verbalized understanding;Tactile cues required            PT Short Term Goals - 12/14/20 1226      PT SHORT TERM GOAL #1   Title Pt will be independent in his HEP.    Time 4    Period Weeks    Status On-going      PT SHORT TERM GOAL #2   Title Pt will be able to tolreate left shoulder flexion >/= 90 degrees passively.    Baseline 75 degrees on 12/06/2020    Time 4    Period Weeks    Status Achieved    Target Date 01/07/21             PT Long Term Goals - 12/14/20 1227      PT LONG TERM GOAL #1   Title Pt will be albe to improve his L shoulder flexion to >/= 150 degrees.    Baseline see flowsheets    Time 8    Period Weeks    Status On-going      PT LONG TERM GOAL #2   Title Pt will improve his left shoulder External Rotation to >/= 70 degrees.    Baseline see flow sheets    Time 8    Period Weeks    Status On-going      PT LONG TERM GOAL #3   Title Pt will be able to improve his Left grip strength to >/= 130 ppsi.    Baseline L grip strength 107 ppsi on 12/06/2020     Time 8    Period Weeks    Status On-going      PT LONG TERM  GOAL #4   Title Pt will improve his FOTO score to >/= 61.    Baseline FOTO score of 30 on 12/06/2020    Time 8    Period Weeks    Status On-going                 Plan - 12/14/20 1055    Clinical Impression Statement Gary Fitzgerald reports sleep is slowly improving.  Effort with and comfort with his HEP is very good.  Emphasis remains on returning full PROM and AAROM with progression into AROM as scapular strength improves.  RTC strengthening will be added when appropriate, allowing time for proper repair healing.    Personal Factors and Comorbidities Comorbidity 2    Comorbidities HTN, migraines, IBS, LBP    Examination-Activity Limitations Dressing;Sleep;Lift;Other    Examination-Participation Restrictions Cleaning;Community Activity;Other;Yard Work    Stability/Clinical Decision Making Stable/Uncomplicated    Rehab Potential Good    PT Frequency Other (comment)    PT Duration 8 weeks    PT Treatment/Interventions ADLs/Self Care Home Management;Cryotherapy;Electrical Stimulation;Therapeutic activities;Functional mobility training;Therapeutic exercise;Neuromuscular re-education;Patient/family education;Manual techniques;Passive range of motion;Vasopneumatic Device;Taping;Ultrasound    PT Next Visit Plan PROM for 6 weeks post op ( 01/06/2021), posture correction, grip strength, modalities as needed    PT Home Exercise Plan Access Code: [email or web address]    Consulted and Agree with Plan of Care Patient           Patient will benefit from skilled therapeutic intervention in order to improve the following deficits and impairments:  Pain,Impaired UE functional use,Postural dysfunction,Impaired flexibility,Increased edema,Decreased range of motion,Decreased strength  Visit Diagnosis: Stiffness of left shoulder, not elsewhere classified  Acute pain of left shoulder  Localized edema     Problem List Patient Active Problem List    Diagnosis Date Noted  . Pain in left shoulder 06/24/2019  . Low back pain 06/24/2019  . Classic migraine with aura 11/11/2013  . Dizziness and giddiness 11/11/2013  . Coital headache 11/11/2013    Cherlyn Cushing PT, MPT 12/14/2020, 12:30 PM  [facility] Physical Therapy [address] [place], [place], [address] Phone: [phone]   Fax:  [phone]  Name: Gary Fitzgerald MRN: [id number] Date of Birth: [birth year]

## 2020-12-14 NOTE — Patient Instructions (Signed)
Added pendulums, shoulder blade pinch and AAROM protraction to HEP

## 2020-12-15 ENCOUNTER — Encounter: Payer: Self-pay | Admitting: Physical Therapy

## 2020-12-15 ENCOUNTER — Ambulatory Visit (INDEPENDENT_AMBULATORY_CARE_PROVIDER_SITE_OTHER): Payer: BC Managed Care – PPO | Admitting: Physical Therapy

## 2020-12-15 DIAGNOSIS — M25512 Pain in left shoulder: Secondary | ICD-10-CM | POA: Diagnosis not present

## 2020-12-15 DIAGNOSIS — M25612 Stiffness of left shoulder, not elsewhere classified: Secondary | ICD-10-CM

## 2020-12-15 DIAGNOSIS — R600 Localized edema: Secondary | ICD-10-CM

## 2020-12-15 NOTE — Therapy (Signed)
[facility] Physical Therapy [address] [place], [place], [address] Phone: [phone]   Fax:  [phone]  Physical Therapy Treatment  Patient Details  Name: Gary Fitzgerald MRN: [id number] Date of Birth: [birth year] Referring Provider (PT): Norlene Campbell, MD   Encounter Date: 12/15/2020   PT End of Session - 12/15/20 0810    Visit Number 4    Number of Visits 18    Date for PT Re-Evaluation 01/31/21    Authorization Type 60 visits/year    PT Start Time 0801    PT Stop Time 0840    PT Time Calculation (min) 39 min    Activity Tolerance Patient tolerated treatment well    Behavior During Therapy [facility] for tasks assessed/performed           Past Medical History:  Diagnosis Date  . Classic migraine with aura 11/11/2013  . 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 Dizziness   . Headache(784.0)   . Hearing loss    asymmetrical  . History of kidney stones   . Hypertension   . Irritable bowel syndrome   . PONV (postoperative nausea and vomiting)   . Renal calculi    Lithotripsy  . Testosterone deficiency     Past Surgical History:  Procedure Laterality Date  . ANTERIOR CRUCIATE LIGAMENT REPAIR     x2  . EXTRACORPOREAL SHOCK WAVE LITHOTRIPSY Left 03/05/2017   Procedure: LEFT EXTRACORPOREAL SHOCK WAVE LITHOTRIPSY (ESWL);  Surgeon: Jerilee Field, MD;  [facility];  Service: Urology;  Laterality: Left;  . EXTRACORPOREAL SHOCK WAVE LITHOTRIPSY Right 11/26/2017   Procedure: RIGHT EXTRACORPOREAL SHOCK WAVE LITHOTRIPSY (ESWL);  Surgeon: Rene Paci, MD;  [facility];  Service: Urology;  Laterality: Right;  . EXTRACORPOREAL SHOCK WAVE LITHOTRIPSY Left 12/05/2018   Procedure: EXTRACORPOREAL SHOCK WAVE LITHOTRIPSY (ESWL);  Surgeon: Ihor Gully, MD;  [facility];  Service: Urology;  Laterality: Left;  . EXTRACORPOREAL SHOCK WAVE LITHOTRIPSY Right 07/26/2020   Procedure: EXTRACORPOREAL SHOCK WAVE LITHOTRIPSY (ESWL);  Surgeon: Marcine Matar, MD;  Location: [facility];  Service: Urology;  Laterality: Right;  . EYE SURGERY     lasik surgery bilat   . HIP SURGERY Right   . KNEE SURGERY Left    x2 left knee  . NERVE, TENDON AND ARTERY REPAIR Right 10/14/2019   Procedure: WOUND EXPLORATION REPAIR EXTENSOR TENDON RIGHT THUMB WITH POSSIBLE EXTENSOR INDICUS PROPRIUS TO EXTENSOR POLICUS LONGUS TRANSFER;  Surgeon: Betha Loa, MD;  Location: [facility];  Service: Orthopedics;  Laterality: Right;  . SHOULDER SURGERY     Bilateral  . Thumb surgery     Tendon repair, right    There were no vitals filed for this visit.   Subjective Assessment - 12/15/20 0805    Subjective Pt arriving to therapy 2/10 pain in left shoulder. Pt stating, "every day is getting a little better".    Pertinent History s/p rotator cuff repair  on 11/25/2020, with reattached supraspinatus tendon, AC joint arthritis, LBP, migraines, HTN, IBS    Limitations Lifting;House hold activities;Other (comment)    Diagnostic tests X-ray    Patient Stated Goals Get back to work without pain    Currently in Pain? Yes    Pain Score 2     Pain Location Shoulder    Pain Orientation Left    Pain Descriptors / Indicators Aching    Pain Type Surgical pain    Pain Onset 1 to 4 weeks ago    Pain Frequency Intermittent  [facility] PT Assessment - 12/15/20 0001      Assessment   Medical Diagnosis L shoulder arthroscopy    Referring Provider (PT) Norlene Campbell, MD    Onset Date/Surgical Date 11/25/20    Hand Dominance Left      PROM   Overall PROM Comments all ROM measured in supine    Right/Left Shoulder Left    Left Shoulder Flexion 140 Degrees    Left Shoulder ABduction 100 Degrees    Left Shoulder Internal Rotation 50 Degrees   shoulder at 90 degrees abduction   Left Shoulder External Rotation 55 Degrees   shoulder at 90 degrees abduciton                        [facility] Adult PT Treatment/Exercise -  12/15/20 0001      Exercises   Exercises Shoulder      Shoulder Exercises: Supine   Other Supine Exercises cervical retraction    Other Supine Exercises cervical rotation holding 5 seconds each side x 5 reps      Shoulder Exercises: Standing   Retraction Both;10 reps;Limitations    Retraction Limitations 5 seconds    Other Standing Exercises Pendulum F/B; CW; CCW 20X      Shoulder Exercises: Stretch   Table Stretch -Flexion Limitations 20 reps    Table Stretch - ABduction Limitations 3-5 second holds    Table Stretch - External Rotation Limitations 20 reps   3-5 second holds   Other Shoulder Stretches scaption table stretch                    PT Short Term Goals - 12/15/20 3151      PT SHORT TERM GOAL #1   Title Pt will be independent in his HEP.    Status On-going      PT SHORT TERM GOAL #2   Title Pt will be able to tolreate left shoulder flexion >/= 90 degrees passively.    Status On-going             PT Long Term Goals - 12/14/20 1227      PT LONG TERM GOAL #1   Title Pt will be albe to improve his L shoulder flexion to >/= 150 degrees.    Baseline see flowsheets    Time 8    Period Weeks    Status On-going      PT LONG TERM GOAL #2   Title Pt will improve his left shoulder External Rotation to >/= 70 degrees.    Baseline see flow sheets    Time 8    Period Weeks    Status On-going      PT LONG TERM GOAL #3   Title Pt will be able to improve his Left grip strength to >/= 130 ppsi.    Baseline L grip strength 107 ppsi on 12/06/2020    Time 8    Period Weeks    Status On-going      PT LONG TERM GOAL #4   Title Pt will improve his FOTO score to >/= 61.    Baseline FOTO score of 30 on 12/06/2020    Time 8    Period Weeks    Status On-going                 Plan - 12/15/20 7616    Clinical Impression Statement Pt arriving reporting 2/10 pain in left shoulder. Pt with compliance with his HEP.  Pt tolerating passive exercises of his left  shoulder and gentle screngthening of cervical spinal muscular and scapular strengthening. Continue with PROM left shoulder until 12/22/2020 per MD order.    Personal Factors and Comorbidities Comorbidity 2    Comorbidities HTN, migraines, IBS, LBP    Examination-Activity Limitations Dressing;Sleep;Lift;Other    Examination-Participation Restrictions Cleaning;Community Activity;Other;Yard Work    Stability/Clinical Decision Making Stable/Uncomplicated    Rehab Potential Good    PT Frequency Other (comment)    PT Duration 8 weeks    PT Treatment/Interventions ADLs/Self Care Home Management;Cryotherapy;Electrical Stimulation;Therapeutic activities;Functional mobility training;Therapeutic exercise;Neuromuscular re-education;Patient/family education;Manual techniques;Passive range of motion;Vasopneumatic Device;Taping;Ultrasound    PT Next Visit Plan PROM for 6 weeks post op ( 01/06/2021), posture correction, grip strength, modalities as needed    PT Home Exercise Plan Access Code: [email or web address]    Consulted and Agree with Plan of Care Patient           Patient will benefit from skilled therapeutic intervention in order to improve the following deficits and impairments:  Pain,Impaired UE functional use,Postural dysfunction,Impaired flexibility,Increased edema,Decreased range of motion,Decreased strength  Visit Diagnosis: Stiffness of left shoulder, not elsewhere classified  Acute pain of left shoulder  Localized edema     Problem List Patient Active Problem List   Diagnosis Date Noted  . Pain in left shoulder 06/24/2019  . Low back pain 06/24/2019  . Classic migraine with aura 11/11/2013  . Dizziness and giddiness 11/11/2013  . Coital headache 11/11/2013    Sharmon Leyden, PT, MPT 12/15/2020, 8:43 AM  [facility] Physical Therapy [address] [place], [place], [address] Phone: [phone]   Fax:  [phone]  Name: Gary Fitzgerald MRN: [id number] Date of  Birth: Apr 06, 1966

## 2020-12-17 ENCOUNTER — Ambulatory Visit (INDEPENDENT_AMBULATORY_CARE_PROVIDER_SITE_OTHER): Payer: BC Managed Care – PPO | Admitting: Rehabilitative and Restorative Service Providers"

## 2020-12-17 ENCOUNTER — Encounter: Payer: Self-pay | Admitting: Rehabilitative and Restorative Service Providers"

## 2020-12-17 ENCOUNTER — Other Ambulatory Visit: Payer: Self-pay

## 2020-12-17 DIAGNOSIS — M25512 Pain in left shoulder: Secondary | ICD-10-CM | POA: Diagnosis not present

## 2020-12-17 DIAGNOSIS — R293 Abnormal posture: Secondary | ICD-10-CM

## 2020-12-17 DIAGNOSIS — R600 Localized edema: Secondary | ICD-10-CM

## 2020-12-17 DIAGNOSIS — M6281 Muscle weakness (generalized): Secondary | ICD-10-CM

## 2020-12-17 DIAGNOSIS — M25612 Stiffness of left shoulder, not elsewhere classified: Secondary | ICD-10-CM

## 2020-12-17 NOTE — Therapy (Signed)
[facility] Physical Therapy [address] [place], [place], [address] Phone: [phone]   Fax:  [phone]  Physical Therapy Treatment  Patient Details  Name: Gary Fitzgerald MRN: [id number] Date of Birth: 05-22-1966 Referring Provider (PT): Norlene Campbell, MD   Encounter Date: 12/17/2020   PT End of Session - 12/17/20 1701    Visit Number 5    Number of Visits 18    Date for PT Re-Evaluation 01/31/21    Authorization Type 60 visits/year    PT Start Time 0930    PT Stop Time 1014    PT Time Calculation (min) 44 min    Activity Tolerance Patient tolerated treatment well;No increased pain    Behavior During Therapy [facility] for tasks assessed/performed           Past Medical History:  Diagnosis Date  . Classic migraine with aura 11/11/2013  . 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 Dizziness   . Headache(784.0)   . Hearing loss    asymmetrical  . History of kidney stones   . Hypertension   . Irritable bowel syndrome   . PONV (postoperative nausea and vomiting)   . Renal calculi    Lithotripsy  . Testosterone deficiency     Past Surgical History:  Procedure Laterality Date  . ANTERIOR CRUCIATE LIGAMENT REPAIR     x2  . EXTRACORPOREAL SHOCK WAVE LITHOTRIPSY Left 03/05/2017   Procedure: LEFT EXTRACORPOREAL SHOCK WAVE LITHOTRIPSY (ESWL);  Surgeon: Jerilee Field, MD;  [facility];  Service: Urology;  Laterality: Left;  . EXTRACORPOREAL SHOCK WAVE LITHOTRIPSY Right 11/26/2017   Procedure: RIGHT EXTRACORPOREAL SHOCK WAVE LITHOTRIPSY (ESWL);  Surgeon: Rene Paci, MD;  [facility];  Service: Urology;  Laterality: Right;  . EXTRACORPOREAL SHOCK WAVE LITHOTRIPSY Left 12/05/2018   Procedure: EXTRACORPOREAL SHOCK WAVE LITHOTRIPSY (ESWL);  Surgeon: Ihor Gully, MD;  [facility];  Service: Urology;  Laterality: Left;  . EXTRACORPOREAL SHOCK WAVE LITHOTRIPSY Right 07/26/2020   Procedure: EXTRACORPOREAL SHOCK WAVE LITHOTRIPSY (ESWL);   Surgeon: Marcine Matar, MD;  Location: [facility];  Service: Urology;  Laterality: Right;  . EYE SURGERY     lasik surgery bilat   . HIP SURGERY Right   . KNEE SURGERY Left    x2 left knee  . NERVE, TENDON AND ARTERY REPAIR Right 10/14/2019   Procedure: WOUND EXPLORATION REPAIR EXTENSOR TENDON RIGHT THUMB WITH POSSIBLE EXTENSOR INDICUS PROPRIUS TO EXTENSOR POLICUS LONGUS TRANSFER;  Surgeon: Betha Loa, MD;  Location: [facility];  Service: Orthopedics;  Laterality: Right;  . SHOULDER SURGERY     Bilateral  . Thumb surgery     Tendon repair, right    There were no vitals filed for this visit.   Subjective Assessment - 12/17/20 1657    Subjective Elison reports overall progress since starting PT.    Pertinent History s/p rotator cuff repair  on 11/25/2020, with reattached supraspinatus tendon, AC joint arthritis, LBP, migraines, HTN, IBS    Limitations Lifting;House hold activities;Other (comment)    Diagnostic tests X-ray    Patient Stated Goals Get back to work without pain    Currently in Pain? Yes    Pain Score 2     Pain Location Shoulder    Pain Orientation Left    Pain Descriptors / Indicators Aching    Pain Type Surgical pain    Pain Onset 1 to 4 weeks ago    Pain Frequency Intermittent    Aggravating Factors  Sleeping and prolonged  postures    Pain Relieving Factors Exercise and change of position    Effect of Pain on Daily Activities Interrupts sleep and unable to use L arm for ADLs    Multiple Pain Sites No              [facility] PT Assessment - 12/17/20 0001      PROM   Right/Left Shoulder Left    Left Shoulder Flexion 130 Degrees    Left Shoulder Internal Rotation 50 Degrees    Left Shoulder External Rotation 60 Degrees                         [facility] Adult PT Treatment/Exercise - 12/17/20 0001      Posture/Postural Control   Posture/Postural Control Postural limitations    Postural Limitations Rounded  Shoulders;Forward head      Exercises   Exercises Shoulder      Shoulder Exercises: Supine   Protraction AAROM;Strengthening;Left;10 reps;Other (comment)    Protraction Weight (lbs) 0#    Protraction Limitations 3 seconds    Flexion AAROM;Left;10 reps   10 seconds, R side helps   Other Supine Exercises Into pillow 5X 5 seconds (cervical retraction)      Shoulder Exercises: Standing   Retraction Strengthening;Both;10 reps;Other (comment)    Retraction Limitations 5 seconds    Other Standing Exercises Pendulum F/B; CW; CCW 20X      Manual Therapy   Manual therapy comments PROM L shoulder IR/ER                  PT Education - 12/17/20 1659    Education Details Reviewed HEP and added supine flexion AAROM with serratus reach for decompress (palm in).    Person(s) Educated Patient    Methods Explanation;Demonstration;Verbal cues;Tactile cues;Handout    Comprehension Verbal cues required;Need further instruction;Returned demonstration;Verbalized understanding;Tactile cues required            PT Short Term Goals - 12/17/20 1700      PT SHORT TERM GOAL #1   Title Pt will be independent in his HEP.    Time 4    Period Weeks    Status On-going    Target Date 01/07/21      PT SHORT TERM GOAL #2   Title Pt will be able to tolreate left shoulder flexion >/= 90 degrees passively.    Baseline 75 degrees on 12/06/2020    Time 4    Period Weeks    Status On-going    Target Date 01/07/21             PT Long Term Goals - 12/17/20 1701      PT LONG TERM GOAL #1   Title Pt will be albe to improve his L shoulder flexion to >/= 150 degrees.    Baseline see flowsheets    Time 8    Period Weeks    Status On-going      PT LONG TERM GOAL #2   Title Pt will improve his left shoulder External Rotation to >/= 70 degrees.    Baseline see flow sheets    Time 8    Period Weeks    Status On-going      PT LONG TERM GOAL #3   Title Pt will be able to improve his Left grip  strength to >/= 130 ppsi.    Baseline L grip strength 107 ppsi on 12/06/2020    Time 8  Period Weeks    Status On-going      PT LONG TERM GOAL #4   Title Pt will improve his FOTO score to >/= 61.    Baseline FOTO score of 30 on 12/06/2020    Time 8    Period Weeks    Status On-going                 Plan - 12/17/20 1703    Clinical Impression Statement Hollis continues to make progress with his post-surgical PT.  PROM and AAROM are progressing.  Sleep is improving but is interrupted by L shoulder pain.  Continue post-surgical protocol to meet LTGs.    Personal Factors and Comorbidities Comorbidity 2    Comorbidities HTN, migraines, IBS, LBP    Examination-Activity Limitations Dressing;Sleep;Lift;Other    Examination-Participation Restrictions Cleaning;Community Activity;Other;Yard Work    Stability/Clinical Decision Making Stable/Uncomplicated    Rehab Potential Good    PT Frequency Other (comment)    PT Duration 8 weeks    PT Treatment/Interventions ADLs/Self Care Home Management;Cryotherapy;Electrical Stimulation;Therapeutic activities;Functional mobility training;Therapeutic exercise;Neuromuscular re-education;Patient/family education;Manual techniques;Passive range of motion;Vasopneumatic Device;Taping;Ultrasound    PT Next Visit Plan PROM for 6 weeks post op ( 01/06/2021), posture correction, grip strength, modalities as needed    PT Home Exercise Plan Access Code: [email or web address]    Consulted and Agree with Plan of Care Patient           Patient will benefit from skilled therapeutic intervention in order to improve the following deficits and impairments:  Pain,Impaired UE functional use,Postural dysfunction,Impaired flexibility,Increased edema,Decreased range of motion,Decreased strength  Visit Diagnosis: Abnormal posture  Localized edema  Muscle weakness (generalized)  Acute pain of left shoulder  Stiffness of left shoulder, not elsewhere classified     Problem  List Patient Active Problem List   Diagnosis Date Noted  . Pain in left shoulder 06/24/2019  . Low back pain 06/24/2019  . Classic migraine with aura 11/11/2013  . Dizziness and giddiness 11/11/2013  . Coital headache 11/11/2013    Cherlyn Cushing PT, MPT 12/17/2020, 5:06 PM  [facility] Physical Therapy [address] [place], [place], [address] Phone: [phone]   Fax:  [phone]  Name: BRALIN GARRY MRN: [id number] Date of Birth: Dec 12, 1965

## 2020-12-20 ENCOUNTER — Encounter: Payer: Self-pay | Admitting: Physical Therapy

## 2020-12-20 ENCOUNTER — Ambulatory Visit (INDEPENDENT_AMBULATORY_CARE_PROVIDER_SITE_OTHER): Payer: BC Managed Care – PPO | Admitting: Physical Therapy

## 2020-12-20 ENCOUNTER — Other Ambulatory Visit: Payer: Self-pay

## 2020-12-20 DIAGNOSIS — M6281 Muscle weakness (generalized): Secondary | ICD-10-CM

## 2020-12-20 DIAGNOSIS — R600 Localized edema: Secondary | ICD-10-CM

## 2020-12-20 DIAGNOSIS — M25612 Stiffness of left shoulder, not elsewhere classified: Secondary | ICD-10-CM

## 2020-12-20 DIAGNOSIS — M25512 Pain in left shoulder: Secondary | ICD-10-CM | POA: Diagnosis not present

## 2020-12-20 DIAGNOSIS — R293 Abnormal posture: Secondary | ICD-10-CM | POA: Diagnosis not present

## 2020-12-20 NOTE — Therapy (Addendum)
[facility] Physical Therapy [address] [place], [place], [address] Phone: [phone]   Fax:  [phone]  Physical Therapy Treatment  Patient Details  Name: Gary Fitzgerald MRN: [id number] Date of Birth: 07-09-1966 Referring Provider (PT): Norlene Campbell, MD   Encounter Date: 12/20/2020   PT End of Session - 12/20/20 1115    Visit Number 6    Number of Visits 18    Date for PT Re-Evaluation 01/31/21    Authorization Type 60 visits/year    PT Start Time 1100    PT Stop Time 1145    PT Time Calculation (min) 45 min    Activity Tolerance Patient tolerated treatment well;No increased pain    Behavior During Therapy WFL for tasks assessed/performed           Past Medical History:  Diagnosis Date  . Classic migraine with aura 11/11/2013  . 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 Dizziness   . Headache(784.0)   . Hearing loss    asymmetrical  . History of kidney stones   . Hypertension   . Irritable bowel syndrome   . PONV (postoperative nausea and vomiting)   . Renal calculi    Lithotripsy  . Testosterone deficiency     Past Surgical History:  Procedure Laterality Date  . ANTERIOR CRUCIATE LIGAMENT REPAIR     x2  . EXTRACORPOREAL SHOCK WAVE LITHOTRIPSY Left 03/05/2017   Procedure: LEFT EXTRACORPOREAL SHOCK WAVE LITHOTRIPSY (ESWL);  Surgeon: Jerilee Field, MD;  Location: [facility];  Service: Urology;  Laterality: Left;  . EXTRACORPOREAL SHOCK WAVE LITHOTRIPSY Right 11/26/2017   Procedure: RIGHT EXTRACORPOREAL SHOCK WAVE LITHOTRIPSY (ESWL);  Surgeon: Rene Paci, MD;  Location: [facility];  Service: Urology;  Laterality: Right;  . EXTRACORPOREAL SHOCK WAVE LITHOTRIPSY Left 12/05/2018   Procedure: EXTRACORPOREAL SHOCK WAVE LITHOTRIPSY (ESWL);  Surgeon: Ihor Gully, MD;  Location: [facility];  Service: Urology;  Laterality: Left;  . EXTRACORPOREAL SHOCK WAVE LITHOTRIPSY Right 07/26/2020   Procedure: EXTRACORPOREAL SHOCK WAVE LITHOTRIPSY (ESWL);   Surgeon: Marcine Matar, MD;  Location: [facility];  Service: Urology;  Laterality: Right;  . EYE SURGERY     lasik surgery bilat   . HIP SURGERY Right   . KNEE SURGERY Left    x2 left knee  . NERVE, TENDON AND ARTERY REPAIR Right 10/14/2019   Procedure: WOUND EXPLORATION REPAIR EXTENSOR TENDON RIGHT THUMB WITH POSSIBLE EXTENSOR INDICUS PROPRIUS TO EXTENSOR POLICUS LONGUS TRANSFER;  Surgeon: Betha Loa, MD;  Location: [facility];  Service: Orthopedics;  Laterality: Right;  . SHOULDER SURGERY     Bilateral  . Thumb surgery     Tendon repair, right    There were no vitals filed for this visit.   Subjective Assessment - 12/20/20 1113    Subjective Pt arriving today reporting 2/10 pain in his left shoulder today. Pt reporting compliance in his HEP.    Pertinent History s/p rotator cuff repair  on 11/25/2020, with reattached supraspinatus tendon, AC joint arthritis, LBP, migraines, HTN, IBS    Limitations Lifting;House hold activities;Other (comment)    How long can you sit comfortably? unlimited    How long can you stand comfortably? unlimited    How long can you walk comfortably? unlimited    Diagnostic tests X-ray    Patient Stated Goals Get back to work without pain    Currently in Pain? Yes    Pain Score 2     Pain Location Shoulder    Pain Orientation Left  Pain Descriptors / Indicators Aching    Pain Type Surgical pain    Pain Onset 1 to 4 weeks ago                             [facility] Adult PT Treatment/Exercise - 12/20/20 0001      Exercises   Exercises Shoulder      Shoulder Exercises: Supine   Other Supine Exercises cervical retraction x 10 holding 5 seconds      Shoulder Exercises: Standing   Retraction Limitations scapular retraction holding 5 seconds 2x10    Other Standing Exercises Pendulum F/B; CW; CCW 20X      Shoulder Exercises: Stretch   Table Stretch -Flexion Limitations 20 reps    Table Stretch -  ABduction Limitations 3 seconds hold    Table Stretch - External Rotation Limitations 20 reps    Other Shoulder Stretches scaption with 3-5 second holds      Vasopneumatic   Number Minutes Vasopneumatic  10 minutes    Vasopnuematic Location  Shoulder    Vasopneumatic Pressure Low    Vasopneumatic Temperature  34      Manual Therapy   Manual therapy comments PROM L shoulder IR/ER   20                   PT Short Term Goals - 12/20/20 1143      PT SHORT TERM GOAL #1   Title Pt will be independent in his HEP.    Status On-going      PT SHORT TERM GOAL #2   Title Pt will be able to tolreate left shoulder flexion >/= 90 degrees passively.    Status On-going             PT Long Term Goals - 12/20/20 1144      PT LONG TERM GOAL #1   Title Pt will be albe to improve his L shoulder flexion to >/= 150 degrees.    Status On-going      PT LONG TERM GOAL #2   Title Pt will improve his left shoulder External Rotation to >/= 70 degrees.    Status On-going      PT LONG TERM GOAL #3   Title Pt will be able to improve his Left grip strength to >/= 130 ppsi.    Baseline L grip strength 107 ppsi on 12/06/2020    Status On-going      PT LONG TERM GOAL #4   Title Pt will improve his FOTO score to >/= 61.    Baseline FOTO score of 30 on 12/06/2020    Status On-going             Addendum to plan: PROM ends 6 weeks post op on 01/05/2021 per initial MD orders.  Narda Amber, PT, MPT       Plan - 12/20/20 1223    Clinical Impression Statement Pt reporting 2/10 upon arrival. Pt is making progress and tolerating passive movements of Left shoulder. Pt still wearing his sling thoughtout the day with increased activity levels. Pt requesting vasopneumatic at end of session. Able to begin more Active Assisted ROM after 12/23/2020 per MD orders. Pt is scheduled to see Dr. Cleophas Dunker on 12/22/2020. Continue skilled PT.    Personal Factors and Comorbidities Comorbidity 2     Comorbidities HTN, migraines, IBS, LBP    Examination-Activity Limitations Dressing;Sleep;Lift;Other    Examination-Participation Restrictions Cleaning;Community Activity;Other;Pincus Badder Work  Stability/Clinical Decision Making Stable/Uncomplicated    Rehab Potential Good    PT Frequency Other (comment)    PT Duration 8 weeks    PT Treatment/Interventions ADLs/Self Care Home Management;Cryotherapy;Electrical Stimulation;Therapeutic activities;Functional mobility training;Therapeutic exercise;Neuromuscular re-education;Patient/family education;Manual techniques;Passive range of motion;Vasopneumatic Device;Taping;Ultrasound    PT Next Visit Plan PROM for 6 weeks post op ( 01/06/2021), posture correction, grip strength, modalities as needed    PT Home Exercise Plan Access Code: [email or web address]    Consulted and Agree with Plan of Care Patient           Patient will benefit from skilled therapeutic intervention in order to improve the following deficits and impairments:  Pain,Impaired UE functional use,Postural dysfunction,Impaired flexibility,Increased edema,Decreased range of motion,Decreased strength  Visit Diagnosis: Abnormal posture  Localized edema  Muscle weakness (generalized)  Acute pain of left shoulder  Stiffness of left shoulder, not elsewhere classified     Problem List Patient Active Problem List   Diagnosis Date Noted  . Pain in left shoulder 06/24/2019  . Low back pain 06/24/2019  . Classic migraine with aura 11/11/2013  . Dizziness and giddiness 11/11/2013  . Coital headache 11/11/2013    Sharmon Leyden, PT, MPT 12/20/2020, 12:37 PM  [facility] Physical Therapy [address] [place], [place], [address] Phone: [phone]   Fax:  [phone]  Name: Gary Fitzgerald MRN: [id number] Date of Birth: Aug 24, 1966

## 2020-12-22 ENCOUNTER — Other Ambulatory Visit: Payer: Self-pay

## 2020-12-22 ENCOUNTER — Encounter: Payer: Self-pay | Admitting: Physical Therapy

## 2020-12-22 ENCOUNTER — Encounter: Payer: Self-pay | Admitting: Orthopaedic Surgery

## 2020-12-22 ENCOUNTER — Ambulatory Visit (INDEPENDENT_AMBULATORY_CARE_PROVIDER_SITE_OTHER): Payer: BC Managed Care – PPO | Admitting: Physical Therapy

## 2020-12-22 ENCOUNTER — Ambulatory Visit (INDEPENDENT_AMBULATORY_CARE_PROVIDER_SITE_OTHER): Payer: BC Managed Care – PPO | Admitting: Orthopaedic Surgery

## 2020-12-22 VITALS — Ht 69.5 in | Wt 201.0 lb

## 2020-12-22 DIAGNOSIS — R293 Abnormal posture: Secondary | ICD-10-CM

## 2020-12-22 DIAGNOSIS — R600 Localized edema: Secondary | ICD-10-CM | POA: Diagnosis not present

## 2020-12-22 DIAGNOSIS — G8929 Other chronic pain: Secondary | ICD-10-CM

## 2020-12-22 DIAGNOSIS — M6281 Muscle weakness (generalized): Secondary | ICD-10-CM

## 2020-12-22 DIAGNOSIS — M25512 Pain in left shoulder: Secondary | ICD-10-CM

## 2020-12-22 DIAGNOSIS — M25612 Stiffness of left shoulder, not elsewhere classified: Secondary | ICD-10-CM

## 2020-12-22 NOTE — Therapy (Signed)
[facility] Physical Therapy [address] [place], [place], [address] Phone: [phone]   Fax:  [phone]  Physical Therapy Treatment  Patient Details  Name: Gary Fitzgerald MRN: [id number] Date of Birth: 12-Aug-1966 Referring Provider (PT): Norlene Campbell, MD   Encounter Date: 12/22/2020   PT End of Session - 12/22/20 1109    Visit Number 7    Number of Visits 18    Date for PT Re-Evaluation 01/31/21    Authorization Type 60 visits/year    PT Start Time 1100    PT Stop Time 1145    PT Time Calculation (min) 45 min    Activity Tolerance Patient tolerated treatment well;No increased pain    Behavior During Therapy WFL for tasks assessed/performed           Past Medical History:  Diagnosis Date  . Classic migraine with aura 11/11/2013  . 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 Dizziness   . Headache(784.0)   . Hearing loss    asymmetrical  . History of kidney stones   . Hypertension   . Irritable bowel syndrome   . PONV (postoperative nausea and vomiting)   . Renal calculi    Lithotripsy  . Testosterone deficiency     Past Surgical History:  Procedure Laterality Date  . ANTERIOR CRUCIATE LIGAMENT REPAIR     x2  . EXTRACORPOREAL SHOCK WAVE LITHOTRIPSY Left 03/05/2017   Procedure: LEFT EXTRACORPOREAL SHOCK WAVE LITHOTRIPSY (ESWL);  Surgeon: Jerilee Field, MD;  Location: [facility];  Service: Urology;  Laterality: Left;  . EXTRACORPOREAL SHOCK WAVE LITHOTRIPSY Right 11/26/2017   Procedure: RIGHT EXTRACORPOREAL SHOCK WAVE LITHOTRIPSY (ESWL);  Surgeon: Rene Paci, MD;  Location: [facility];  Service: Urology;  Laterality: Right;  . EXTRACORPOREAL SHOCK WAVE LITHOTRIPSY Left 12/05/2018   Procedure: EXTRACORPOREAL SHOCK WAVE LITHOTRIPSY (ESWL);  Surgeon: Ihor Gully, MD;  Location: [facility];  Service: Urology;  Laterality: Left;  . EXTRACORPOREAL SHOCK WAVE LITHOTRIPSY Right 07/26/2020   Procedure: EXTRACORPOREAL SHOCK WAVE LITHOTRIPSY (ESWL);   Surgeon: Marcine Matar, MD;  Location: [facility];  Service: Urology;  Laterality: Right;  . EYE SURGERY     lasik surgery bilat   . HIP SURGERY Right   . KNEE SURGERY Left    x2 left knee  . NERVE, TENDON AND ARTERY REPAIR Right 10/14/2019   Procedure: WOUND EXPLORATION REPAIR EXTENSOR TENDON RIGHT THUMB WITH POSSIBLE EXTENSOR INDICUS PROPRIUS TO EXTENSOR POLICUS LONGUS TRANSFER;  Surgeon: Betha Loa, MD;  Location: [facility];  Service: Orthopedics;  Laterality: Right;  . SHOULDER SURGERY     Bilateral  . Thumb surgery     Tendon repair, right    There were no vitals filed for this visit.   Subjective Assessment - 12/22/20 1105    Subjective Pt arriving today reporting 2/10 pain in left shoulder. Pt scheduled to see Dr. Cleophas Dunker today at 1:30 pm.    Pertinent History s/p rotator cuff repair  on 11/25/2020, with reattached supraspinatus tendon, AC joint arthritis, LBP, migraines, HTN, IBS    Limitations Lifting;House hold activities;Other (comment)    Diagnostic tests X-ray    Patient Stated Goals Get back to work without pain    Currently in Pain? Yes    Pain Score 2     Pain Location Shoulder    Pain Orientation Left    Pain Descriptors / Indicators Aching    Pain Type Surgical pain    Pain Onset 1 to 4 weeks ago  [facility] PT Assessment - 12/22/20 0001      Assessment   Medical Diagnosis L shoulder arthroscopy    Referring Provider (PT) Norlene Campbell, MD    Onset Date/Surgical Date 11/25/20    Hand Dominance Left      PROM   Right/Left Shoulder Left    Left Shoulder Flexion 155 Degrees    Left Shoulder ABduction 110 Degrees    Left Shoulder Internal Rotation 50 Degrees    Left Shoulder External Rotation 65 Degrees      Palpation   Palpation comment TTP: over incicion site, supraspinatus tendon insertion                         [facility] Adult PT Treatment/Exercise - 12/22/20 0001      Exercises    Exercises Shoulder      Shoulder Exercises: Supine   Other Supine Exercises cervical retraction x 10 holding 5 seconds,      Shoulder Exercises: Stretch   Table Stretch -Flexion Limitations 20 reps    Table Stretch - ABduction Limitations 3 seconds hold    Table Stretch - External Rotation Limitations 20 reps    Other Shoulder Stretches scaption with 3-5 second holds    Other Shoulder Stretches upper trap stretch      Vasopneumatic   Number Minutes Vasopneumatic  10 minutes    Vasopnuematic Location  Shoulder    Vasopneumatic Pressure Low    Vasopneumatic Temperature  34      Manual Therapy   Manual therapy comments PROM L shoulder IR/ER   20 minutes                   PT Short Term Goals - 12/22/20 1206      PT SHORT TERM GOAL #1   Title Pt will be independent in his HEP.    Status Achieved      PT SHORT TERM GOAL #2   Title Pt will be able to tolreate left shoulder flexion >/= 90 degrees passively.    Baseline 155 passively    Status Achieved             PT Long Term Goals - 12/22/20 1206      PT LONG TERM GOAL #1   Title Pt will be albe to improve his L shoulder flexion to >/= 150 degrees.    Status On-going      PT LONG TERM GOAL #2   Title Pt will improve his left shoulder External Rotation to >/= 70 degrees.    Status On-going      PT LONG TERM GOAL #3   Title Pt will be able to improve his Left grip strength to >/= 130 ppsi.    Status On-going      PT LONG TERM GOAL #4   Title Pt will improve his FOTO score to >/= 61.    Status On-going                 Plan - 12/22/20 1207    Clinical Impression Statement Pt arriving today with 2/10 pain in left shoulder. Pt still wearing sling with activities throughout the day.Pt tolerating only passive ROM exercises and is scheduled to see Dr. Cleophas Dunker today at 1:30pm.  Pt with gains in PROM. (flexion: 155 degrees, ER: 65 degrees, Abduction: 110 degrees, IR: 60 degrees). We will continue to  progress pt with more active assisted exericses based on MD recommendations at pt's appointment  today.    Personal Factors and Comorbidities Comorbidity 2    Comorbidities HTN, migraines, IBS, LBP    Examination-Activity Limitations Dressing;Sleep;Lift;Other    Examination-Participation Restrictions Cleaning;Community Activity;Other;Yard Work    Stability/Clinical Decision Making Stable/Uncomplicated    Rehab Potential Good    PT Frequency Other (comment)    PT Treatment/Interventions ADLs/Self Care Home Management;Cryotherapy;Electrical Stimulation;Therapeutic activities;Functional mobility training;Therapeutic exercise;Neuromuscular re-education;Patient/family education;Manual techniques;Passive range of motion;Vasopneumatic Device;Taping;Ultrasound    PT Next Visit Plan PROM for 6 weeks post op ( 01/06/2021), posture correction, grip strength, modalities as needed    PT Home Exercise Plan Access Code: [email or web address]    Consulted and Agree with Plan of Care Patient           Patient will benefit from skilled therapeutic intervention in order to improve the following deficits and impairments:  Pain,Impaired UE functional use,Postural dysfunction,Impaired flexibility,Increased edema,Decreased range of motion,Decreased strength  Visit Diagnosis: Abnormal posture  Localized edema  Muscle weakness (generalized)  Acute pain of left shoulder  Stiffness of left shoulder, not elsewhere classified     Problem List Patient Active Problem List   Diagnosis Date Noted  . Pain in left shoulder 06/24/2019  . Low back pain 06/24/2019  . Classic migraine with aura 11/11/2013  . Dizziness and giddiness 11/11/2013  . Coital headache 11/11/2013    Sharmon Leyden, PT, MPT 12/22/2020, 12:14 PM  [facility] Physical Therapy [address] [place], [place], [address] Phone: [phone]   Fax:  [phone]  Name: Gary Fitzgerald MRN: [id number] Date of Birth:  10/04/1966

## 2020-12-22 NOTE — Progress Notes (Signed)
Office Visit Note   Patient: Gary Fitzgerald           Date of Birth: [birth year]           MRN: [id number] Visit Date: 12/22/2020              Requested by: [facility]  [address] [place],  [place] [address] PCP: Coralee Rud, PA-C   Assessment & Plan: Visit Diagnoses:  1. Chronic left shoulder pain     Plan: 1 month status post left shoulder arthroscopy with an SCD, DCR and mini open rotator cuff tear repair. Doing well. Has achieved almost full passive overhead motion and abduction. Continue with the sling. Continue with the physical therapy and reevaluate in 3 weeks. May progress with active assisted therapy. Has been working full-time  Follow-Up Instructions: Return in about 3 weeks (around 01/12/2021).   Orders:  No orders of the defined types were placed in this encounter.  No orders of the defined types were placed in this encounter.     Procedures: No procedures performed   Clinical Data: No additional findings.   Subjective: Chief Complaint  Patient presents with  . Left Shoulder - Follow-up    Left shoulder arthroscopy 11/25/2020  Patient presents today for follow up on his left shoulder. He had a left shoulder arthroscopy on 11/25/2020. He is now 4 weeks out from surgery. Patient is going to physical therapy three times a week. He is wearing his sling. He is not taking anything for pain.   HPI  Review of Systems   Objective: Vital Signs: Ht 5' 9.5" (1.765 m)   Wt 201 lb (91.2 kg)   BMI 29.26 kg/m   Physical Exam  Ortho Exam left shoulder incisions healing without problem. I could abduct passively 90 degrees and fully overhead flex. Neurologically intact  Specialty Comments:  No specialty comments available.  Imaging: No results found.   PMFS History: Patient Active Problem List   Diagnosis Date Noted  . Pain in left shoulder 06/24/2019  . Low back pain 06/24/2019  . Classic migraine with aura  11/11/2013  . Dizziness and giddiness 11/11/2013  . Coital headache 11/11/2013   Past Medical History:  Diagnosis Date  . Classic migraine with aura 11/11/2013  . 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 Dizziness   . Headache(784.0)   . Hearing loss    asymmetrical  . History of kidney stones   . Hypertension   . Irritable bowel syndrome   . PONV (postoperative nausea and vomiting)   . Renal calculi    Lithotripsy  . Testosterone deficiency     Family History  Problem Relation Age of Onset  . High blood pressure Mother   . Hypertension Mother   . High blood pressure Father   . Cancer - Prostate Father   . Skin cancer Father   . Hypertension Father   . Hypertension Brother   . Diabetes Brother     Past Surgical History:  Procedure Laterality Date  . ANTERIOR CRUCIATE LIGAMENT REPAIR     x2  . EXTRACORPOREAL SHOCK WAVE LITHOTRIPSY Left 03/05/2017   Procedure: LEFT EXTRACORPOREAL SHOCK WAVE LITHOTRIPSY (ESWL);  Surgeon: Jerilee Field, MD;  Location: [facility];  Service: Urology;  Laterality: Left;  . EXTRACORPOREAL SHOCK WAVE LITHOTRIPSY Right 11/26/2017   Procedure: RIGHT EXTRACORPOREAL SHOCK WAVE LITHOTRIPSY (ESWL);  Surgeon: Rene Paci, MD;  Location: [facility];  Service: Urology;  Laterality: Right;  .  EXTRACORPOREAL SHOCK WAVE LITHOTRIPSY Left 12/05/2018   Procedure: EXTRACORPOREAL SHOCK WAVE LITHOTRIPSY (ESWL);  Surgeon: Ihor Gully, MD;  Location: [facility];  Service: Urology;  Laterality: Left;  . EXTRACORPOREAL SHOCK WAVE LITHOTRIPSY Right 07/26/2020   Procedure: EXTRACORPOREAL SHOCK WAVE LITHOTRIPSY (ESWL);  Surgeon: Marcine Matar, MD;  Location: [facility];  Service: Urology;  Laterality: Right;  . EYE SURGERY     lasik surgery bilat   . HIP SURGERY Right   . KNEE SURGERY Left    x2 left knee  . NERVE, TENDON AND ARTERY REPAIR Right 10/14/2019   Procedure: WOUND EXPLORATION REPAIR EXTENSOR TENDON RIGHT THUMB WITH POSSIBLE EXTENSOR INDICUS  PROPRIUS TO EXTENSOR POLICUS LONGUS TRANSFER;  Surgeon: Betha Loa, MD;  Location: [facility];  Service: Orthopedics;  Laterality: Right;  . SHOULDER SURGERY     Bilateral  . Thumb surgery     Tendon repair, right   Social History   Occupational History    Comment: [employer]  Tobacco Use  . Smoking status: Never Smoker  . Smokeless tobacco: Never Used  Vaping Use  . Vaping Use: Never used  Substance and Sexual Activity  . Alcohol use: No  . Drug use: Not Currently  . Sexual activity: Not on file

## 2020-12-24 ENCOUNTER — Ambulatory Visit (INDEPENDENT_AMBULATORY_CARE_PROVIDER_SITE_OTHER): Payer: BC Managed Care – PPO | Admitting: Rehabilitative and Restorative Service Providers"

## 2020-12-24 ENCOUNTER — Encounter: Payer: Self-pay | Admitting: Rehabilitative and Restorative Service Providers"

## 2020-12-24 ENCOUNTER — Other Ambulatory Visit: Payer: Self-pay

## 2020-12-24 DIAGNOSIS — M6281 Muscle weakness (generalized): Secondary | ICD-10-CM

## 2020-12-24 DIAGNOSIS — M25512 Pain in left shoulder: Secondary | ICD-10-CM | POA: Diagnosis not present

## 2020-12-24 DIAGNOSIS — R600 Localized edema: Secondary | ICD-10-CM

## 2020-12-24 DIAGNOSIS — R293 Abnormal posture: Secondary | ICD-10-CM | POA: Diagnosis not present

## 2020-12-24 DIAGNOSIS — M25612 Stiffness of left shoulder, not elsewhere classified: Secondary | ICD-10-CM

## 2020-12-24 NOTE — Therapy (Signed)
[facility] Physical Therapy [address] [place], [place], [address] Phone: [phone]   Fax:  [phone]  Physical Therapy Treatment  Patient Details  Name: Gary Fitzgerald MRN: [id number] Date of Birth: 1966-06-08 Referring Provider (PT): Norlene Campbell, MD   Encounter Date: 12/24/2020   PT End of Session - 12/24/20 1231    Visit Number 8    Number of Visits 18    Date for PT Re-Evaluation 01/31/21    Authorization Type 60 visits/year    PT Start Time 1148    PT Stop Time 1232    PT Time Calculation (min) 44 min    Activity Tolerance Patient tolerated treatment well;No increased pain    Behavior During Therapy WFL for tasks assessed/performed           Past Medical History:  Diagnosis Date  . Classic migraine with aura 11/11/2013  . 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 Dizziness   . Headache(784.0)   . Hearing loss    asymmetrical  . History of kidney stones   . Hypertension   . Irritable bowel syndrome   . PONV (postoperative nausea and vomiting)   . Renal calculi    Lithotripsy  . Testosterone deficiency     Past Surgical History:  Procedure Laterality Date  . ANTERIOR CRUCIATE LIGAMENT REPAIR     x2  . EXTRACORPOREAL SHOCK WAVE LITHOTRIPSY Left 03/05/2017   Procedure: LEFT EXTRACORPOREAL SHOCK WAVE LITHOTRIPSY (ESWL);  Surgeon: Jerilee Field, MD;  Location: [facility];  Service: Urology;  Laterality: Left;  . EXTRACORPOREAL SHOCK WAVE LITHOTRIPSY Right 11/26/2017   Procedure: RIGHT EXTRACORPOREAL SHOCK WAVE LITHOTRIPSY (ESWL);  Surgeon: Rene Paci, MD;  Location: [facility];  Service: Urology;  Laterality: Right;  . EXTRACORPOREAL SHOCK WAVE LITHOTRIPSY Left 12/05/2018   Procedure: EXTRACORPOREAL SHOCK WAVE LITHOTRIPSY (ESWL);  Surgeon: Ihor Gully, MD;  Location: [facility];  Service: Urology;  Laterality: Left;  . EXTRACORPOREAL SHOCK WAVE LITHOTRIPSY Right 07/26/2020   Procedure: EXTRACORPOREAL SHOCK WAVE LITHOTRIPSY (ESWL);   Surgeon: Marcine Matar, MD;  Location: [facility];  Service: Urology;  Laterality: Right;  . EYE SURGERY     lasik surgery bilat   . HIP SURGERY Right   . KNEE SURGERY Left    x2 left knee  . NERVE, TENDON AND ARTERY REPAIR Right 10/14/2019   Procedure: WOUND EXPLORATION REPAIR EXTENSOR TENDON RIGHT THUMB WITH POSSIBLE EXTENSOR INDICUS PROPRIUS TO EXTENSOR POLICUS LONGUS TRANSFER;  Surgeon: Betha Loa, MD;  Location: [facility];  Service: Orthopedics;  Laterality: Right;  . SHOULDER SURGERY     Bilateral  . Thumb surgery     Tendon repair, right    There were no vitals filed for this visit.   Subjective Assessment - 12/24/20 1227    Subjective Socrates reports getting 4-5 hours of uninterrupted sleep.  Up from last time we checked.    Pertinent History s/p rotator cuff repair  on 11/25/2020, with reattached supraspinatus tendon, AC joint arthritis, LBP, migraines, HTN, IBS    Limitations Lifting;House hold activities;Other (comment)    Diagnostic tests X-ray    Patient Stated Goals Get back to work without pain    Currently in Pain? Yes    Pain Score 2     Pain Location Shoulder    Pain Orientation Left    Pain Descriptors / Indicators Aching    Pain Type Surgical pain    Pain Onset More than a month ago    Pain Frequency Intermittent  Aggravating Factors  Sleeping and L UE use (with mouse at work)    Pain Relieving Factors Exercise and change of position    Effect of Pain on Daily Activities Interrupts sleep and unable to use L arm with most ADLs    Multiple Pain Sites No              [facility] PT Assessment - 12/24/20 0001      PROM   Left Shoulder Flexion 135 Degrees    Left Shoulder Internal Rotation 60 Degrees    Left Shoulder External Rotation 60 Degrees                         [facility] Adult PT Treatment/Exercise - 12/24/20 0001      Exercises   Exercises Shoulder      Shoulder Exercises: Supine   Protraction  AAROM;Strengthening;Left;20 reps;Other (comment)    Protraction Weight (lbs) 0#    Protraction Limitations 3 seconds    External Rotation AROM;Left;10 reps;Other (comment)   10 seconds   Internal Rotation AROM;Left;10 reps;Other (comment)   10 seconds   Flexion AAROM;Left;10 reps   10 seconds, R side helps   Other Supine Exercises Into pillow 5X 5 seconds (cervical retraction)      Shoulder Exercises: Standing   Retraction Strengthening;Both;10 reps;Other (comment)   5 seconds shoulder blade pinches   Other Standing Exercises Pendulum F/B; CW; CCW 20X      Manual Therapy   Manual therapy comments PROM L shoulder IR/ER                  PT Education - 12/24/20 1229    Education Details Updated HEP with OK from Dr. Cleophas Dunker to appropriately progress HEP (AAROM and AROM).    Person(s) Educated Patient    Methods Explanation;Demonstration;Tactile cues;Verbal cues;Handout    Comprehension Verbal cues required;Need further instruction;Returned demonstration;Verbalized understanding;Tactile cues required            PT Short Term Goals - 12/24/20 1231      PT SHORT TERM GOAL #1   Title Pt will be independent in his HEP.    Status Achieved      PT SHORT TERM GOAL #2   Title Pt will be able to tolreate left shoulder flexion >/= 90 degrees passively.    Baseline 155 passively    Status Achieved             PT Long Term Goals - 12/24/20 1231      PT LONG TERM GOAL #1   Title Pt will be albe to improve his L shoulder flexion to >/= 150 degrees.    Status On-going      PT LONG TERM GOAL #2   Title Pt will improve his left shoulder External Rotation to >/= 70 degrees.    Status On-going      PT LONG TERM GOAL #3   Title Pt will be able to improve his Left grip strength to >/= 130 ppsi.    Status On-going      PT LONG TERM GOAL #4   Title Pt will improve his FOTO score to >/= 61.    Status On-going                 Plan - 12/24/20 1232    Clinical  Impression Statement [place] notes better sleep and ROM over the past week.  He will be staying in the brace for another 2 weeks  per Dr. Hoy Register recommendations.  OK to progress to [facility] and AROM.  Reminded E[place] to be patient and conservative with L UE use to allow adequate healing and for long-term success.    Personal Factors and Comorbidities Comorbidity 2    Comorbidities HTN, migraines, IBS, LBP    Examination-Activity Limitations Dressing;Sleep;Lift;Other    Examination-Participation Restrictions Cleaning;Community Activity;Other;Yard Work    Stability/Clinical Decision Making Stable/Uncomplicated    Rehab Potential Good    PT Frequency Other (comment)    PT Treatment/Interventions ADLs/Self Care Home Management;Cryotherapy;Electrical Stimulation;Therapeutic activities;Functional mobility training;Therapeutic exercise;Neuromuscular re-education;Patient/family education;Manual techniques;Passive range of motion;Vasopneumatic Device;Taping;Ultrasound    PT Next Visit Plan PROM for 6 weeks post op ( 01/06/2021), posture correction, grip strength, modalities as needed    PT Home Exercise Plan Access Code: [email or web address]    Consulted and Agree with Plan of Care Patient           Patient will benefit from skilled therapeutic intervention in order to improve the following deficits and impairments:  Pain,Impaired UE functional use,Postural dysfunction,Impaired flexibility,Increased edema,Decreased range of motion,Decreased strength  Visit Diagnosis: Abnormal posture  Localized edema  Muscle weakness (generalized)  Acute pain of left shoulder  Stiffness of left shoulder, not elsewhere classified     Problem List Patient Active Problem List   Diagnosis Date Noted  . Pain in left shoulder 06/24/2019  . Low back pain 06/24/2019  . Classic migraine with aura 11/11/2013  . Dizziness and giddiness 11/11/2013  . Coital headache 11/11/2013    Cherlyn Cushing PT, MPT 12/24/2020, 12:39  PM  [facility] Physical Therapy [address] [place], [place], [address] Phone: [phone]   Fax:  [phone]  Name: ROCKLAND KOTARSKI MRN: [id number] Date of Birth: [birth year]

## 2020-12-27 ENCOUNTER — Ambulatory Visit (INDEPENDENT_AMBULATORY_CARE_PROVIDER_SITE_OTHER): Payer: BC Managed Care – PPO | Admitting: Physical Therapy

## 2020-12-27 ENCOUNTER — Other Ambulatory Visit: Payer: Self-pay

## 2020-12-27 ENCOUNTER — Encounter: Payer: Self-pay | Admitting: Physical Therapy

## 2020-12-27 DIAGNOSIS — R600 Localized edema: Secondary | ICD-10-CM | POA: Diagnosis not present

## 2020-12-27 DIAGNOSIS — R293 Abnormal posture: Secondary | ICD-10-CM

## 2020-12-27 DIAGNOSIS — M25612 Stiffness of left shoulder, not elsewhere classified: Secondary | ICD-10-CM

## 2020-12-27 DIAGNOSIS — M6281 Muscle weakness (generalized): Secondary | ICD-10-CM | POA: Diagnosis not present

## 2020-12-27 DIAGNOSIS — M25512 Pain in left shoulder: Secondary | ICD-10-CM

## 2020-12-27 NOTE — Therapy (Signed)
[facility] Physical [facility] [address] [place], [place], [address] Phone: [phone]   Fax:  [phone]  Physical Therapy Treatment  Patient Details  Name: Gary Fitzgerald MRN: [id number] Date of Birth: [birth year] Referring Provider (PT): Norlene Campbell, MD   Encounter Date: 12/27/2020   PT End of Session - 12/27/20 1442    Visit Number 9    Number of Visits 18    Date for PT Re-Evaluation 01/31/21    Authorization Type 60 visits/year    PT Start Time 1345    PT Stop Time 1435    PT Time Calculation (min) 50 min    Activity Tolerance Patient tolerated treatment well;No increased pain    Behavior During Therapy WFL for tasks assessed/performed           Past Medical History:  Diagnosis Date  . Classic migraine with aura 11/11/2013  . 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 Dizziness   . Headache(784.0)   . Hearing loss    asymmetrical  . History of kidney stones   . Hypertension   . Irritable bowel syndrome   . PONV (postoperative nausea and vomiting)   . Renal calculi    Lithotripsy  . Testosterone deficiency     Past Surgical History:  Procedure Laterality Date  . ANTERIOR CRUCIATE LIGAMENT REPAIR     x2  . EXTRACORPOREAL SHOCK WAVE LITHOTRIPSY Left 03/05/2017   Procedure: LEFT EXTRACORPOREAL SHOCK WAVE LITHOTRIPSY (ESWL);  Surgeon: Jerilee Field, MD;  [facility];  Service: Urology;  Laterality: Left;  . EXTRACORPOREAL SHOCK WAVE LITHOTRIPSY Right 11/26/2017   Procedure: RIGHT EXTRACORPOREAL SHOCK WAVE LITHOTRIPSY (ESWL);  Surgeon: Rene Paci, MD;  [facility];  Service: Urology;  Laterality: Right;  . EXTRACORPOREAL SHOCK WAVE LITHOTRIPSY Left 12/05/2018   Procedure: EXTRACORPOREAL SHOCK WAVE LITHOTRIPSY (ESWL);  Surgeon: Ihor Gully, MD;  [facility];  Service: Urology;  Laterality: Left;  . EXTRACORPOREAL SHOCK WAVE LITHOTRIPSY Right 07/26/2020   Procedure: EXTRACORPOREAL SHOCK WAVE LITHOTRIPSY (ESWL);   Surgeon: Marcine Matar, MD;  Location: [facility];  Service: Urology;  Laterality: Right;  . EYE SURGERY     lasik surgery bilat   . HIP SURGERY Right   . KNEE SURGERY Left    x2 left knee  . NERVE, TENDON AND ARTERY REPAIR Right 10/14/2019   Procedure: WOUND EXPLORATION REPAIR EXTENSOR TENDON RIGHT THUMB WITH POSSIBLE EXTENSOR INDICUS PROPRIUS TO EXTENSOR POLICUS LONGUS TRANSFER;  Surgeon: Betha Loa, MD;  Location: [facility];  Service: Orthopedics;  Laterality: Right;  . SHOULDER SURGERY     Bilateral  . Thumb surgery     Tendon repair, right    There were no vitals filed for this visit.   Subjective Assessment - 12/27/20 1359    Subjective Pt arriving today reporting his sleep has improved and is able to stay in the bed all night instead of going from recliner to bed. Pt reporting 1/10 in left shoulder upon arrival. Still wearing sling.    Pertinent History s/p rotator cuff repair  on 11/25/2020, with reattached supraspinatus tendon, AC joint arthritis, LBP, migraines, HTN, IBS    Limitations Lifting;House hold activities;Other (comment)    Diagnostic tests X-ray    Patient Stated Goals Get back to work without pain    Currently in Pain? Yes    Pain Score 1     Pain Location Shoulder    Pain Orientation Left    Pain Descriptors / Indicators Aching  Pain Type Surgical pain    Pain Onset More than a month ago                             [facility] Adult PT Treatment/Exercise - 12/27/20 0001      Exercises   Exercises Shoulder      Shoulder Exercises: Supine   External Rotation AAROM;Left;20 reps    External Rotation Limitations 2# bar    Flexion AAROM;Left;20 reps    Flexion Limitations 2# bar      Shoulder Exercises: Seated   Other Seated Exercises UE ranger circles CW and CCW x 30 reps      Shoulder Exercises: Pulleys   Flexion 2 minutes    Scaption 2 minutes      Shoulder Exercises: Stretch   Wall Stretch -  Flexion 5 reps;10 seconds    Wall Stretch - Flexion Limitations 2 sets using a towel and bilateral UE      Modalities   Modalities Vasopneumatic      Vasopneumatic   Number Minutes Vasopneumatic  10 minutes    Vasopnuematic Location  Shoulder    Vasopneumatic Pressure Low    Vasopneumatic Temperature  34      Manual Therapy   Manual therapy comments PROM L shoulder IR/ER                  PT Education - 12/27/20 1431    Education Details Discussed Dr. Cleophas Dunker released pt to begin active assisted  exercises and follow up with him in 3 weeks.    Person(s) Educated Patient    Methods Explanation    Comprehension Verbalized understanding            PT Short Term Goals - 12/27/20 1451      PT SHORT TERM GOAL #1   Title Pt will be independent in his HEP.    Status Achieved      PT SHORT TERM GOAL #2   Title Pt will be able to tolreate left shoulder flexion >/= 90 degrees passively.    Baseline 155 passively    Status On-going             PT Long Term Goals - 12/27/20 1452      PT LONG TERM GOAL #1   Title Pt will be albe to improve his L shoulder flexion to >/= 150 degrees.    Status On-going      PT LONG TERM GOAL #2   Title Pt will improve his left shoulder External Rotation to >/= 70 degrees.    Status On-going      PT LONG TERM GOAL #3   Title Pt will be able to improve his Left grip strength to >/= 130 ppsi.    Status On-going      PT LONG TERM GOAL #4   Title Pt will improve his FOTO score to >/= 61.    Status On-going                 Plan - 12/27/20 1421    Clinical Impression Statement Pt arriving today reporting 1/10 pain in left shoulder. Pt still wearing sling per Dr. Hoy Register order with activities. Pt released to begin active assisted exercises on 12/22/2020 by Dr. Cleophas Dunker.PT's HEP was updated to include more active assisted exercises.  Pt tolerating exercises well with pain increasing to 4/10 with PROM end ranges and active  assisted exercises. Pt requesting vasopneumatic  at end of session. Continue with skilled PT.    Personal Factors and Comorbidities Comorbidity 2    Comorbidities HTN, migraines, IBS, LBP    Examination-Activity Limitations Dressing;Sleep;Lift;Other    Examination-Participation Restrictions Cleaning;Community Activity;Other;Yard Work    Stability/Clinical Decision Making Stable/Uncomplicated    Rehab Potential Good    PT Frequency Other (comment)    PT Duration 8 weeks    PT Treatment/Interventions ADLs/Self Care Home Management;Cryotherapy;Electrical Stimulation;Therapeutic activities;Functional mobility training;Therapeutic exercise;Neuromuscular re-education;Patient/family education;Manual techniques;Passive range of motion;Vasopneumatic Device;Taping;Ultrasound    PT Next Visit Plan Begin AAROM until 6 weeks post op ( 01/06/2021), posture correction, grip strength, modalities as needed    PT Home Exercise Plan Access Code: [email or web address]    Consulted and Agree with Plan of Care Patient           Patient will benefit from skilled therapeutic intervention in order to improve the following deficits and impairments:  Pain,Impaired UE functional use,Postural dysfunction,Impaired flexibility,Increased edema,Decreased range of motion,Decreased strength  Visit Diagnosis: Abnormal posture  Localized edema  Muscle weakness (generalized)  Acute pain of left shoulder  Stiffness of left shoulder, not elsewhere classified     Problem List Patient Active Problem List   Diagnosis Date Noted  . Pain in left shoulder 06/24/2019  . Low back pain 06/24/2019  . Classic migraine with aura 11/11/2013  . Dizziness and giddiness 11/11/2013  . Coital headache 11/11/2013    Sharmon Leyden, PT, MPT 12/27/2020, 3:01 PM  [facility] Physical Therapy [address] [place], [place], [address] Phone: [phone]   Fax:  [phone]  Name: SCORPIO FORTIN MRN: [id number] Date of  Birth: [birth year]

## 2020-12-27 NOTE — Patient Instructions (Signed)
Access Code: [email or web address] URL: [email or web address] Date: 12/27/2020 Prepared by: Narda Amber  Exercises Seated Shoulder Flexion Towel Slide at Table Top - 2-3 x daily - 7 x weekly - 2 sets - 10 reps Seated Shoulder Scaption Slide at Table Top with Forearm in Neutral - 2-3 x daily - 7 x weekly - 2 sets - 10 reps Seated Shoulder External Rotation PROM on Table - 2-3 x daily - 7 x weekly - 2 sets - 10 reps Standing Scapular Retraction - 5 x daily - 7 x weekly - 1 sets - 5 reps - 5 second hold Supine Scapular Protraction in Flexion with Dumbbells - 2 x daily - 7 x weekly - 1 sets - 10-20 reps - 3 seconds hold Supine Shoulder External Rotation Stretch - 1 x daily - 7 x weekly - 1 sets - 10-20 reps - 10 seconds hold Supine Shoulder Internal Rotation Stretch - 1 x daily - 7 x weekly - 1 sets - 10-20 reps - 10 secondsinutes hold Supine Shoulder Flexion Extension AAROM with Dowel - 2 x daily - 7 x weekly - 2 sets - 10 reps Seated Shoulder Circles AAROM with Dowel into Wall - 2 x daily - 7 x weekly - 30 reps Standing shoulder flexion wall slides - 2 x daily - 7 x weekly - 10 reps

## 2020-12-29 ENCOUNTER — Other Ambulatory Visit: Payer: Self-pay

## 2020-12-29 ENCOUNTER — Ambulatory Visit (INDEPENDENT_AMBULATORY_CARE_PROVIDER_SITE_OTHER): Payer: BC Managed Care – PPO | Admitting: Physical Therapy

## 2020-12-29 ENCOUNTER — Encounter: Payer: Self-pay | Admitting: Physical Therapy

## 2020-12-29 DIAGNOSIS — M6281 Muscle weakness (generalized): Secondary | ICD-10-CM | POA: Diagnosis not present

## 2020-12-29 DIAGNOSIS — R600 Localized edema: Secondary | ICD-10-CM

## 2020-12-29 DIAGNOSIS — R293 Abnormal posture: Secondary | ICD-10-CM | POA: Diagnosis not present

## 2020-12-29 DIAGNOSIS — M25612 Stiffness of left shoulder, not elsewhere classified: Secondary | ICD-10-CM

## 2020-12-29 DIAGNOSIS — M25512 Pain in left shoulder: Secondary | ICD-10-CM | POA: Diagnosis not present

## 2020-12-29 NOTE — Therapy (Signed)
[facility] [address] [place], [place], [address] Phone: [phone]   Fax:  [phone]  Physical Therapy Treatment  Patient Details  Name: Gary Fitzgerald MRN: [id number] Date of Birth: 19-Jul-1966 Referring Provider (PT): Norlene Campbell, MD   Encounter Date: 12/29/2020   PT End of Session - 12/29/20 1153    Visit Number 10    Number of Visits 18    Date for PT Re-Evaluation 01/31/21    Authorization Type 60 visits/year    PT Start Time 1100    PT Stop Time 1150    PT Time Calculation (min) 50 min    Activity Tolerance Patient tolerated treatment well;No increased pain    Behavior During Therapy WFL for tasks assessed/performed           Past Medical History:  Diagnosis Date  . Classic migraine with aura 11/11/2013  . 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 Dizziness   . Headache(784.0)   . Hearing loss    asymmetrical  . History of kidney stones   . Hypertension   . Irritable bowel syndrome   . PONV (postoperative nausea and vomiting)   . Renal calculi    Lithotripsy  . Testosterone deficiency     Past Surgical History:  Procedure Laterality Date  . ANTERIOR CRUCIATE LIGAMENT REPAIR     x2  . EXTRACORPOREAL SHOCK WAVE LITHOTRIPSY Left 03/05/2017   Procedure: LEFT EXTRACORPOREAL SHOCK WAVE LITHOTRIPSY (ESWL);  Surgeon: Jerilee Field, MD;  Location: [facility];  Service: Urology;  Laterality: Left;  . EXTRACORPOREAL SHOCK WAVE LITHOTRIPSY Right 11/26/2017   Procedure: RIGHT EXTRACORPOREAL SHOCK WAVE LITHOTRIPSY (ESWL);  Surgeon: Rene Paci, MD;  Location: [facility];  Service: Urology;  Laterality: Right;  . EXTRACORPOREAL SHOCK WAVE LITHOTRIPSY Left 12/05/2018   Procedure: EXTRACORPOREAL SHOCK WAVE LITHOTRIPSY (ESWL);  Surgeon: Ihor Gully, MD;  Location: [facility];  Service: Urology;  Laterality: Left;  . EXTRACORPOREAL SHOCK WAVE LITHOTRIPSY Right 07/26/2020   Procedure: EXTRACORPOREAL SHOCK WAVE LITHOTRIPSY (ESWL);   Surgeon: Marcine Matar, MD;  Location[facility];  Service: Urology;  Laterality: Right;  . EYE SURGERY     lasik surgery bilat   . HIP SURGERY Right   . KNEE SURGERY Left    x2 left knee  . NERVE, TENDON AND ARTERY REPAIR Right 10/14/2019   Procedure: WOUND EXPLORATION REPAIR EXTENSOR TENDON RIGHT THUMB WITH POSSIBLE EXTENSOR INDICUS PROPRIUS TO EXTENSOR POLICUS LONGUS TRANSFER;  Surgeon: Betha Loa, MD;  Location: [facility];  Service: Orthopedics;  Laterality: Right;  . SHOULDER SURGERY     Bilateral  . Thumb surgery     Tendon repair, right    There were no vitals filed for this visit.   Subjective Assessment - 12/29/20 1107    Subjective Pt arriving today reporting 1/10 pain in left shoulder. Pt arriving wearing his sling.    Pertinent History s/p rotator cuff repair  on 11/25/2020, with reattached supraspinatus tendon, AC joint arthritis, LBP, migraines, HTN, IBS    Limitations Lifting;House hold activities;Other (comment)    Currently in Pain? Yes    Pain Location Shoulder    Pain Orientation Left    Pain Descriptors / Indicators Aching    Pain Type Surgical pain    Pain Onset More than a month ago    Pain Frequency Intermittent              [facility] PT Assessment - 12/29/20 0001      Assessment  Medical Diagnosis L shouulder arthroscopy    Referring Provider (PT) Norlene Campbell, MD    Onset Date/Surgical Date 11/25/20    Hand Dominance Left      Precautions   Precaution Comments MD released pt to perform active assisted ROM                         [facility] Adult PT Treatment/Exercise - 12/29/20 0001      Exercises   Exercises Shoulder      Shoulder Exercises: Supine   External Rotation AAROM;Left;20 reps    External Rotation Limitations 2# bar    Flexion AAROM;Left;20 reps    Flexion Limitations 2# bar      Shoulder Exercises: Seated   Other Seated Exercises UE ranger circles CW and CCW x 30 reps       Shoulder Exercises: Standing   Other Standing Exercises using 2# bar to assist with abdcution of left shoulder  15 reps      Shoulder Exercises: Pulleys   Flexion 3 minutes    Scaption 3 minutes      Shoulder Exercises: Stretch   Wall Stretch - Flexion 5 reps;10 seconds    Other Shoulder Stretches Door stretch for ER x 3 holding 10 seconds      Modalities   Modalities Vasopneumatic      Vasopneumatic   Number Minutes Vasopneumatic  10 minutes    Vasopnuematic Location  Shoulder    Vasopneumatic Pressure Low    Vasopneumatic Temperature  34      Manual Therapy   Manual therapy comments PROM L shoulder flexion, ER, IR and abduction.                    PT Short Term Goals - 12/29/20 1204      PT SHORT TERM GOAL #1   Title Pt will be independent in his HEP.    Status Achieved      PT SHORT TERM GOAL #2   Title Pt will be able to tolreate left shoulder flexion >/= 90 degrees passively.    Baseline 155 passively    Status Achieved             PT Long Term Goals - 12/29/20 1205      PT LONG TERM GOAL #1   Status On-going      PT LONG TERM GOAL #2   Title Pt will improve his left shoulder External Rotation to >/= 70 degrees.    Status On-going      PT LONG TERM GOAL #3   Title Pt will be able to improve his Left grip strength to >/= 130 ppsi.    Status On-going      PT LONG TERM GOAL #4   Title Pt will improve his FOTO score to >/= 61.    Status On-going                 Plan - 12/29/20 1202    Clinical Impression Statement Pt arriving today reporting 1/10 pain in his left shoulder. Pt compliant wearing his sling. Pt tolerating more active assistive exercises well today and great progress with PROM in L shoulder. Pt stating he purchased pulleys for home use. Pt to drop his frequency to 2x/ week due to HEP program compliance. Continue skilled PT to progress toward pt's PLOF per MD orders.    Personal Factors and Comorbidities Comorbidity 2     Comorbidities HTN, migraines,  IBS, LBP    Examination-Activity Limitations Dressing;Sleep;Lift;Other    Examination-Participation Restrictions Cleaning;Community Activity;Other;Yard Work    Stability/Clinical Decision Making Stable/Uncomplicated    Rehab Potential Good    PT Frequency Other (comment)    PT Duration 8 weeks    PT Treatment/Interventions ADLs/Self Care Home Management;Cryotherapy;Electrical Stimulation;Therapeutic activities;Functional mobility training;Therapeutic exercise;Neuromuscular re-education;Patient/family education;Manual techniques;Passive range of motion;Vasopneumatic Device;Taping;Ultrasound    PT Next Visit Plan Begin AAROM until 6 weeks post op ( 01/06/2021), posture correction, grip strength, modalities as needed    PT Home Exercise Plan Access Code: [email or web address]    Consulted and Agree with Plan of Care Patient           Patient will benefit from skilled therapeutic intervention in order to improve the following deficits and impairments:  Pain,Impaired UE functional use,Postural dysfunction,Impaired flexibility,Increased edema,Decreased range of motion,Decreased strength  Visit Diagnosis: Abnormal posture  Localized edema  Muscle weakness (generalized)  Acute pain of left shoulder  Stiffness of left shoulder, not elsewhere classified     Problem List Patient Active Problem List   Diagnosis Date Noted  . Pain in left shoulder 06/24/2019  . Low back pain 06/24/2019  . Classic migraine with aura 11/11/2013  . Dizziness and giddiness 11/11/2013  . Coital headache 11/11/2013    Sharmon Leyden, PT, MPT 12/29/2020, 12:06 PM  [facility] [address] [place], [place], [address] Phone: [phone]   Fax:  [phone]  Name: Gary Fitzgerald MRN: [id number] Date of Birth: 05/14/1966

## 2020-12-31 ENCOUNTER — Encounter: Payer: BC Managed Care – PPO | Admitting: Rehabilitative and Restorative Service Providers"

## 2021-01-03 ENCOUNTER — Other Ambulatory Visit: Payer: Self-pay

## 2021-01-03 ENCOUNTER — Encounter: Payer: Self-pay | Admitting: Physical Therapy

## 2021-01-03 ENCOUNTER — Ambulatory Visit (INDEPENDENT_AMBULATORY_CARE_PROVIDER_SITE_OTHER): Payer: BC Managed Care – PPO | Admitting: Physical Therapy

## 2021-01-03 DIAGNOSIS — M25512 Pain in left shoulder: Secondary | ICD-10-CM

## 2021-01-03 DIAGNOSIS — R600 Localized edema: Secondary | ICD-10-CM | POA: Diagnosis not present

## 2021-01-03 DIAGNOSIS — M6281 Muscle weakness (generalized): Secondary | ICD-10-CM

## 2021-01-03 DIAGNOSIS — M25612 Stiffness of left shoulder, not elsewhere classified: Secondary | ICD-10-CM

## 2021-01-03 DIAGNOSIS — R293 Abnormal posture: Secondary | ICD-10-CM | POA: Diagnosis not present

## 2021-01-03 NOTE — Therapy (Signed)
[facility] [address] [place], [place], [address] Phone: [phone]   Fax:  [phone]  Physical Therapy Treatment  Patient Details  Name: Gary Fitzgerald MRN: [id number] Date of Birth: 04-Oct-1966 Referring Provider (PT): Norlene Campbell, MD   Encounter Date: 01/03/2021   PT End of Session - 01/03/21 1211    Visit Number 11    Number of Visits 18    Date for PT Re-Evaluation 01/31/21    Authorization Type 60 visits/year    PT Start Time 1145    PT Stop Time 1230    PT Time Calculation (min) 45 min    Activity Tolerance Patient tolerated treatment well;No increased pain    Behavior During Therapy WFL for tasks assessed/performed           Past Medical History:  Diagnosis Date  . Classic migraine with aura 11/11/2013  . 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 Dizziness   . Headache(784.0)   . Hearing loss    asymmetrical  . History of kidney stones   . Hypertension   . Irritable bowel syndrome   . PONV (postoperative nausea and vomiting)   . Renal calculi    Lithotripsy  . Testosterone deficiency     Past Surgical History:  Procedure Laterality Date  . ANTERIOR CRUCIATE LIGAMENT REPAIR     x2  . EXTRACORPOREAL SHOCK WAVE LITHOTRIPSY Left 03/05/2017   Procedure: LEFT EXTRACORPOREAL SHOCK WAVE LITHOTRIPSY (ESWL);  Surgeon: Jerilee Field, MD;  Location: [facility];  Service: Urology;  Laterality: Left;  . EXTRACORPOREAL SHOCK WAVE LITHOTRIPSY Right 11/26/2017   Procedure: RIGHT EXTRACORPOREAL SHOCK WAVE LITHOTRIPSY (ESWL);  Surgeon: Rene Paci, MD;  Location: [facility];  Service: Urology;  Laterality: Right;  . EXTRACORPOREAL SHOCK WAVE LITHOTRIPSY Left 12/05/2018   Procedure: EXTRACORPOREAL SHOCK WAVE LITHOTRIPSY (ESWL);  Surgeon: Ihor Gully, MD;  Location: [facility];  Service: Urology;  Laterality: Left;  . EXTRACORPOREAL SHOCK WAVE LITHOTRIPSY Right 07/26/2020   Procedure: EXTRACORPOREAL SHOCK WAVE LITHOTRIPSY (ESWL);   Surgeon: Marcine Matar, MD;  Location: [facility];  Service: Urology;  Laterality: Right;  . EYE SURGERY     lasik surgery bilat   . HIP SURGERY Right   . KNEE SURGERY Left    x2 left knee  . NERVE, TENDON AND ARTERY REPAIR Right 10/14/2019   Procedure: WOUND EXPLORATION REPAIR EXTENSOR TENDON RIGHT THUMB WITH POSSIBLE EXTENSOR INDICUS PROPRIUS TO EXTENSOR POLICUS LONGUS TRANSFER;  Surgeon: Betha Loa, MD;  Location: [facility];  Service: Orthopedics;  Laterality: Right;  . SHOULDER SURGERY     Bilateral  . Thumb surgery     Tendon repair, right    There were no vitals filed for this visit.   Subjective Assessment - 01/03/21 1151    Subjective Pt reporting no pain upon arrival in left shoulder. Pt still compliant with wearing his sling. Pt stating his pulleys are going well at home.    Pertinent History s/p rotator cuff repair  on 11/25/2020, with reattached supraspinatus tendon, AC joint arthritis, LBP, migraines, HTN, IBS    Limitations Lifting;House hold activities;Other (comment)    How long can you stand comfortably? unlimited    How long can you walk comfortably? unlimited    Diagnostic tests X-ray    Patient Stated Goals Get back to work without pain    Currently in Pain? No/denies              [facility] PT Assessment - 01/03/21 0001  Assessment   Medical Diagnosis L shouulder arthroscopy    Referring Provider (PT) Norlene Campbell, MD    Onset Date/Surgical Date 11/25/20    Hand Dominance Left      Observation/Other Assessments   Focus on Therapeutic Outcomes (FOTO)  44      PROM   Left Shoulder Flexion 160 Degrees    Left Shoulder ABduction 130 Degrees    Left Shoulder Internal Rotation 60 Degrees    Left Shoulder External Rotation 65 Degrees                         [facility] Adult PT Treatment/Exercise - 01/03/21 0001      Exercises   Exercises Shoulder      Shoulder Exercises: Supine   External Rotation  AAROM;Left;20 reps    External Rotation Limitations 3#    Flexion AAROM;Left;20 reps    Flexion Limitations 3# bar      Shoulder Exercises: Seated   Other Seated Exercises UE ranger circles CW and CCW x 30 reps      Shoulder Exercises: Standing   Extension AAROM;Both;10 reps    Extension Limitations using bar    Other Standing Exercises using bar to assist with abduction of left shoulder x  15 reps    Other Standing Exercises wall ladder x 10      Shoulder Exercises: Pulleys   Flexion 3 minutes    Scaption 3 minutes      Modalities   Modalities Vasopneumatic      Vasopneumatic   Number Minutes Vasopneumatic  10 minutes    Vasopnuematic Location  Shoulder    Vasopneumatic Pressure Low    Vasopneumatic Temperature  34      Manual Therapy   Manual therapy comments PROM L shoulder flexion, ER, IR and abduction.                    PT Short Term Goals - 12/29/20 1204      PT SHORT TERM GOAL #1   Title Pt will be independent in his HEP.    Status Achieved      PT SHORT TERM GOAL #2   Title Pt will be able to tolreate left shoulder flexion >/= 90 degrees passively.    Baseline 155 passively    Status Achieved             PT Long Term Goals - 01/03/21 1206      PT LONG TERM GOAL #1   Title Pt will be albe to improve his L shoulder flexion to >/= 150 degrees.    Baseline see flowsheets    Status On-going      PT LONG TERM GOAL #2   Title Pt will improve his left shoulder External Rotation to >/= 70 degrees.    Baseline see flow sheets    Status On-going      PT LONG TERM GOAL #3   Title Pt will be able to improve his Left grip strength to >/= 130 ppsi.    Baseline L grip strength 107 ppsi on 12/06/2020, L grip strength 114ppsi on 01/03/2021.    Time 8    Period Weeks    Status On-going      PT LONG TERM GOAL #4   Title Pt will improve his FOTO score to >/= 61.    Status On-going                 Plan -  01/03/21 1156    Clinical Impression  Statement Pt arriving today reporting no pain in left shoulder. Pt compliant in his HEP and sling wearing. Pt has a follow up with Dr. Cleophas Dunker next week where hopefully we can began more strengthening exercises.Pt's FOTO score increased to 44 (predicted is 61).  Pt has made overall PROM progress still progressing with rotational components. PROM: flexion160 degrees, ER 65 degrees, abduction: 130 degrees, IR 60 degrees. Cotninue skilled PT.    Personal Factors and Comorbidities Comorbidity 2    Comorbidities HTN, migraines, IBS, LBP    Examination-Activity Limitations Dressing;Sleep;Lift;Other    Examination-Participation Restrictions Cleaning;Community Activity;Other;Yard Work    Stability/Clinical Decision Making Stable/Uncomplicated    Rehab Potential Good    PT Frequency Other (comment)    PT Duration 8 weeks    PT Treatment/Interventions ADLs/Self Care Home Management;Cryotherapy;Electrical Stimulation;Therapeutic activities;Functional mobility training;Therapeutic exercise;Neuromuscular re-education;Patient/family education;Manual techniques;Passive range of motion;Vasopneumatic Device;Taping;Ultrasound    PT Next Visit Plan Begin AAROM until 6 weeks post op ( 01/06/2021), posture correction, grip strength, modalities as needed    PT Home Exercise Plan Access Code: [email or web address]    Consulted and Agree with Plan of Care Patient           Patient will benefit from skilled therapeutic intervention in order to improve the following deficits and impairments:  Pain,Impaired UE functional use,Postural dysfunction,Impaired flexibility,Increased edema,Decreased range of motion,Decreased strength  Visit Diagnosis: Abnormal posture  Localized edema  Muscle weakness (generalized)  Acute pain of left shoulder  Stiffness of left shoulder, not elsewhere classified     Problem List Patient Active Problem List   Diagnosis Date Noted  . Pain in left shoulder 06/24/2019  . Low back pain  06/24/2019  . Classic migraine with aura 11/11/2013  . Dizziness and giddiness 11/11/2013  . Coital headache 11/11/2013    Sharmon Leyden, PT, MPT 01/03/2021, 12:30 PM  [facility] [address] [place], [place], [address] Phone: [phone]   Fax:  [phone]  Name: Gary Fitzgerald MRN: [id number] Date of Birth: 09-04-1966

## 2021-01-05 ENCOUNTER — Other Ambulatory Visit: Payer: Self-pay

## 2021-01-05 ENCOUNTER — Ambulatory Visit (INDEPENDENT_AMBULATORY_CARE_PROVIDER_SITE_OTHER): Payer: BC Managed Care – PPO | Admitting: Physical Therapy

## 2021-01-05 ENCOUNTER — Encounter: Payer: Self-pay | Admitting: Physical Therapy

## 2021-01-05 DIAGNOSIS — M6281 Muscle weakness (generalized): Secondary | ICD-10-CM | POA: Diagnosis not present

## 2021-01-05 DIAGNOSIS — R293 Abnormal posture: Secondary | ICD-10-CM

## 2021-01-05 DIAGNOSIS — R600 Localized edema: Secondary | ICD-10-CM

## 2021-01-05 DIAGNOSIS — M25612 Stiffness of left shoulder, not elsewhere classified: Secondary | ICD-10-CM

## 2021-01-05 DIAGNOSIS — M25512 Pain in left shoulder: Secondary | ICD-10-CM

## 2021-01-05 NOTE — Therapy (Signed)
[facility] [address] [place], [place], [address] Phone: [phone]   Fax:  [phone]  Physical Therapy Treatment  Patient Details  Name: Gary Fitzgerald MRN: [id number] Date of Birth: 1965-11-04 Referring Provider (PT): Norlene Campbell, MD   Encounter Date: 01/05/2021   PT End of Session - 01/05/21 1219    Visit Number 12    Number of Visits 18    Date for PT Re-Evaluation 01/31/21    Authorization Type 60 visits/year    PT Start Time 1105    PT Stop Time 1150    PT Time Calculation (min) 45 min    Activity Tolerance Patient tolerated treatment well;No increased pain    Behavior During Therapy WFL for tasks assessed/performed           Past Medical History:  Diagnosis Date  . Classic migraine with aura 11/11/2013  . 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 Dizziness   . Headache(784.0)   . Hearing loss    asymmetrical  . History of kidney stones   . Hypertension   . Irritable bowel syndrome   . PONV (postoperative nausea and vomiting)   . Renal calculi    Lithotripsy  . Testosterone deficiency     Past Surgical History:  Procedure Laterality Date  . ANTERIOR CRUCIATE LIGAMENT REPAIR     x2  . EXTRACORPOREAL SHOCK WAVE LITHOTRIPSY Left 03/05/2017   Procedure: LEFT EXTRACORPOREAL SHOCK WAVE LITHOTRIPSY (ESWL);  Surgeon: Jerilee Field, MD;  Location: [facility];  Service: Urology;  Laterality: Left;  . EXTRACORPOREAL SHOCK WAVE LITHOTRIPSY Right 11/26/2017   Procedure: RIGHT EXTRACORPOREAL SHOCK WAVE LITHOTRIPSY (ESWL);  Surgeon: Rene Paci, MD;  Location: [facility];  Service: Urology;  Laterality: Right;  . EXTRACORPOREAL SHOCK WAVE LITHOTRIPSY Left 12/05/2018   Procedure: EXTRACORPOREAL SHOCK WAVE LITHOTRIPSY (ESWL);  Surgeon: Ihor Gully, MD;  Location[facility];  Service: Urology;  Laterality: Left;  . EXTRACORPOREAL SHOCK WAVE LITHOTRIPSY Right 07/26/2020   Procedure: EXTRACORPOREAL SHOCK WAVE LITHOTRIPSY (ESWL);   Surgeon: Marcine Matar, MD;  Location: [facility];  Service: Urology;  Laterality: Right;  . EYE SURGERY     lasik surgery bilat   . HIP SURGERY Right   . KNEE SURGERY Left    x2 left knee  . NERVE, TENDON AND ARTERY REPAIR Right 10/14/2019   Procedure: WOUND EXPLORATION REPAIR EXTENSOR TENDON RIGHT THUMB WITH POSSIBLE EXTENSOR INDICUS PROPRIUS TO EXTENSOR POLICUS LONGUS TRANSFER;  Surgeon: Betha Loa, MD;  Location: [facility];  Service: Orthopedics;  Laterality: Right;  . SHOULDER SURGERY     Bilateral  . Thumb surgery     Tendon repair, right    There were no vitals filed for this visit.   Subjective Assessment - 01/05/21 1217    Subjective Pt arriving today reporting 3/10 pain in left shoulder. Pt feels he may have slept on it last night.    Pertinent History s/p rotator cuff repair  on 11/25/2020, with reattached supraspinatus tendon, AC joint arthritis, LBP, migraines, HTN, IBS    Limitations Lifting;House hold activities;Other (comment)    How long can you sit comfortably? unlimited    How long can you stand comfortably? unlimited    How long can you walk comfortably? unlimited    Diagnostic tests X-ray    Patient Stated Goals Get back to work without pain    Currently in Pain? Yes    Pain Score 3     Pain Location Shoulder    Pain  Orientation Left    Pain Descriptors / Indicators Sore    Pain Type Surgical pain    Pain Onset More than a month ago              [facility] PT Assessment - 01/05/21 0001      PROM   Left Shoulder Flexion 160 Degrees    Left Shoulder ABduction 130 Degrees    Left Shoulder Internal Rotation 60 Degrees    Left Shoulder External Rotation 65 Degrees                         [facility] Adult PT Treatment/Exercise - 01/05/21 0001      Exercises   Exercises Shoulder      Shoulder Exercises: Supine   External Rotation AAROM;Left;20 reps    External Rotation Limitations 3#    Flexion  AAROM;Left;20 reps    Flexion Limitations 3# bar      Shoulder Exercises: Standing   Extension AAROM;Both;10 reps    Extension Limitations using bar    Other Standing Exercises using bar to assist with abduction of left shoulder x  15 reps    Other Standing Exercises wall ladder x 10      Shoulder Exercises: Pulleys   ABduction 2 minutes      Shoulder Exercises: Stretch   Other Shoulder Stretches Door stretch 4 positions (120 degrees, 60 degrees, 45 degrees and 30 degrees) each x 2 holding 30 seconds      Modalities   Modalities Vasopneumatic      Vasopneumatic   Number Minutes Vasopneumatic  10 minutes    Vasopnuematic Location  Shoulder    Vasopneumatic Pressure Low    Vasopneumatic Temperature  34      Manual Therapy   Manual therapy comments PROM L shoulder flexion, ER, IR and abduction.grade 2-3 GH AP mobs                    PT Short Term Goals - 12/29/20 1204      PT SHORT TERM GOAL #1   Title Pt will be independent in his HEP.    Status Achieved      PT SHORT TERM GOAL #2   Title Pt will be able to tolreate left shoulder flexion >/= 90 degrees passively.    Baseline 155 passively    Status Achieved             PT Long Term Goals - 01/05/21 1225      PT LONG TERM GOAL #1   Title Pt will be albe to improve his L shoulder flexion to >/= 150 degrees.    Status On-going      PT LONG TERM GOAL #2   Title Pt will improve his left shoulder External Rotation to >/= 70 degrees.      PT LONG TERM GOAL #3   Title Pt will be able to improve his Left grip strength to >/= 130 ppsi.    Status On-going      PT LONG TERM GOAL #4   Title Pt will improve his FOTO score to >/= 61.    Status On-going                 Plan - 01/05/21 1220    Clinical Impression Statement Pt arriving today reporting 3/10 pain in left shoulder. Pt compliant in his HEP, still wearing sling. Pt has a follow up with Dr. Cleophas Dunker on 01/13/2021. Pt  progressing with ER and IR  ROM of left shoulder. Pt currently performing active assistive exercises hopefully will be cleared for strengtheing during his MD visit. Continue skilled PT.    Personal Factors and Comorbidities Comorbidity 2    Comorbidities HTN, migraines, IBS, LBP    Examination-Activity Limitations Dressing;Sleep;Lift;Other    Examination-Participation Restrictions Cleaning;Community Activity;Other;Yard Work    Stability/Clinical Decision Making Stable/Uncomplicated    Rehab Potential Good    PT Frequency Other (comment)    PT Duration 8 weeks    PT Treatment/Interventions ADLs/Self Care Home Management;Cryotherapy;Electrical Stimulation;Therapeutic activities;Functional mobility training;Therapeutic exercise;Neuromuscular re-education;Patient/family education;Manual techniques;Passive range of motion;Vasopneumatic Device;Taping;Ultrasound    PT Next Visit Plan Ronnald Nian, MD appointment on 01/13/2021,  posture correction, grip strength, shoulder mobs    PT Home Exercise Plan Access Code: [email or web address]    Consulted and Agree with Plan of Care Patient           Patient will benefit from skilled therapeutic intervention in order to improve the following deficits and impairments:  Pain,Impaired UE functional use,Postural dysfunction,Impaired flexibility,Increased edema,Decreased range of motion,Decreased strength  Visit Diagnosis: Abnormal posture  Localized edema  Muscle weakness (generalized)  Acute pain of left shoulder  Stiffness of left shoulder, not elsewhere classified     Problem List Patient Active Problem List   Diagnosis Date Noted  . Pain in left shoulder 06/24/2019  . Low back pain 06/24/2019  . Classic migraine with aura 11/11/2013  . Dizziness and giddiness 11/11/2013  . Coital headache 11/11/2013    Sharmon Leyden, PT, MPT 01/05/2021, 12:26 PM  [facility] [address] [place], [place], [address] Phone: [phone]   Fax:   [phone]  Name: Gary Fitzgerald MRN: [id number] Date of Birth: [birth year]

## 2021-01-10 ENCOUNTER — Other Ambulatory Visit: Payer: Self-pay

## 2021-01-10 ENCOUNTER — Ambulatory Visit (INDEPENDENT_AMBULATORY_CARE_PROVIDER_SITE_OTHER): Payer: BC Managed Care – PPO | Admitting: Physical Therapy

## 2021-01-10 ENCOUNTER — Encounter: Payer: Self-pay | Admitting: Physical Therapy

## 2021-01-10 DIAGNOSIS — M25512 Pain in left shoulder: Secondary | ICD-10-CM | POA: Diagnosis not present

## 2021-01-10 DIAGNOSIS — M6281 Muscle weakness (generalized): Secondary | ICD-10-CM

## 2021-01-10 DIAGNOSIS — R600 Localized edema: Secondary | ICD-10-CM

## 2021-01-10 DIAGNOSIS — R293 Abnormal posture: Secondary | ICD-10-CM

## 2021-01-10 DIAGNOSIS — M25612 Stiffness of left shoulder, not elsewhere classified: Secondary | ICD-10-CM | POA: Diagnosis not present

## 2021-01-10 NOTE — Therapy (Signed)
[facility] [address] [place], [place], [address] Phone: [phone]   Fax:  [phone]  Physical Therapy Treatment  Patient Details  Name: Gary Fitzgerald MRN: [id number] Date of Birth: 1966-02-02 Referring Provider (PT): Norlene Campbell MD   Encounter Date: 01/10/2021   PT End of Session - 01/10/21 1156    Visit Number 13    Number of Visits 18    Date for PT Re-Evaluation 01/31/21    Authorization Type 60 visits/year    PT Start Time 1146    PT Stop Time 1225    PT Time Calculation (min) 39 min    Activity Tolerance Patient tolerated treatment well;No increased pain    Behavior During Therapy WFL for tasks assessed/performed           Past Medical History:  Diagnosis Date  . Classic migraine with aura 11/11/2013  . 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 Dizziness   . Headache(784.0)   . Hearing loss    asymmetrical  . History of kidney stones   . Hypertension   . Irritable bowel syndrome   . PONV (postoperative nausea and vomiting)   . Renal calculi    Lithotripsy  . Testosterone deficiency     Past Surgical History:  Procedure Laterality Date  . ANTERIOR CRUCIATE LIGAMENT REPAIR     x2  . EXTRACORPOREAL SHOCK WAVE LITHOTRIPSY Left 03/05/2017   Procedure: LEFT EXTRACORPOREAL SHOCK WAVE LITHOTRIPSY (ESWL);  Surgeon: Jerilee Field, MD;  Location: [facility];  Service: Urology;  Laterality: Left;  . EXTRACORPOREAL SHOCK WAVE LITHOTRIPSY Right 11/26/2017   Procedure: RIGHT EXTRACORPOREAL SHOCK WAVE LITHOTRIPSY (ESWL);  Surgeon: Rene Paci, MD;  Location: [facility];  Service: Urology;  Laterality: Right;  . EXTRACORPOREAL SHOCK WAVE LITHOTRIPSY Left 12/05/2018   Procedure: EXTRACORPOREAL SHOCK WAVE LITHOTRIPSY (ESWL);  Surgeon: Ihor Gully, MD;  Location: [facility];  Service: Urology;  Laterality: Left;  . EXTRACORPOREAL SHOCK WAVE LITHOTRIPSY Right 07/26/2020   Procedure: EXTRACORPOREAL SHOCK WAVE LITHOTRIPSY (ESWL);   Surgeon: Marcine Matar, MD;  Location: [facility];  Service: Urology;  Laterality: Right;  . EYE SURGERY     lasik surgery bilat   . HIP SURGERY Right   . KNEE SURGERY Left    x2 left knee  . NERVE, TENDON AND ARTERY REPAIR Right 10/14/2019   Procedure: WOUND EXPLORATION REPAIR EXTENSOR TENDON RIGHT THUMB WITH POSSIBLE EXTENSOR INDICUS PROPRIUS TO EXTENSOR POLICUS LONGUS TRANSFER;  Surgeon: Betha Loa, MD;  Location: [facility];  Service: Orthopedics;  Laterality: Right;  . SHOULDER SURGERY     Bilateral  . Thumb surgery     Tendon repair, right    There were no vitals filed for this visit.   Subjective Assessment - 01/10/21 1155    Subjective Pt arriving today reporting no pain in left shoulder. Pt reporting he has been propping his arm up on pillows at night and even when sitting on couch which seems to help with the pain.    Pertinent History s/p rotator cuff repair  on 11/25/2020, with reattached supraspinatus tendon, AC joint arthritis, LBP, migraines, HTN, IBS    Limitations Lifting;House hold activities;Other (comment)    How long can you sit comfortably? unlimited    Currently in Pain? No/denies              [facility] PT Assessment - 01/10/21 0001      Assessment   Medical Diagnosis L arthroscopy    Referring Provider (PT) Norlene Campbell  MD    Onset Date/Surgical Date 11/25/20    Hand Dominance Left      PROM   Overall PROM Comments measure in supine    Left Shoulder Flexion 160 Degrees    Left Shoulder ABduction 130 Degrees    Left Shoulder Internal Rotation 70 Degrees   shoulder at 90 degrees abduction   Left Shoulder External Rotation 72 Degrees   shoulder at 90 degrees abduction                        [facility] Adult PT Treatment/Exercise - 01/10/21 0001      Exercises   Exercises Shoulder      Shoulder Exercises: Supine   External Rotation AAROM;Left;20 reps    External Rotation Limitations 3# bar    Flexion  AAROM;Left;20 reps    Flexion Limitations 3# bar      Shoulder Exercises: Seated   Other Seated Exercises UE ranger circles x 30 seconds each direction      Shoulder Exercises: Standing   Extension AAROM;Both;10 reps    Extension Limitations using bar    Other Standing Exercises wall wash x 10 hodling 5 seconds each      Shoulder Exercises: Pulleys   Flexion 2 minutes    ABduction 2 minutes      Shoulder Exercises: Stretch   Other Shoulder Stretches Door stretch 4 positions (120 degrees, 60 degrees, 45 degrees and 30 degrees) each x 2 holding 30 seconds      Modalities   Modalities Vasopneumatic      Vasopneumatic   Number Minutes Vasopneumatic  10 minutes    Vasopnuematic Location  Shoulder    Vasopneumatic Pressure Medium    Vasopneumatic Temperature  34      Manual Therapy   Manual therapy comments PROM L shoulder flexion, ER, IR and abduction.grade 2-3 GH AP mobs              [facility] PT Assessment - 01/10/21 0001      Assessment   Medical Diagnosis L arthroscopy    Referring Provider (PT) Norlene Campbell MD    Onset Date/Surgical Date 11/25/20    Hand Dominance Left      PROM   Overall PROM Comments measure in supine    Left Shoulder Flexion 160 Degrees    Left Shoulder ABduction 130 Degrees    Left Shoulder Internal Rotation 70 Degrees   shoulder at 90 degrees abduction   Left Shoulder External Rotation 72 Degrees   shoulder at 90 degrees abduction                  PT Short Term Goals - 01/10/21 1203      PT SHORT TERM GOAL #1   Title Pt will be independent in his HEP.    Status Achieved      PT SHORT TERM GOAL #2   Title Pt will be able to tolreate left shoulder flexion >/= 90 degrees passively.    Status Achieved             PT Long Term Goals - 01/10/21 1203      PT LONG TERM GOAL #1   Title Pt will be albe to improve his L shoulder flexion to >/= 150 degrees.    Status On-going      PT LONG TERM GOAL #2   Title Pt will improve  his left shoulder External Rotation to >/= 70 degrees.    Status  On-going      PT LONG TERM GOAL #3   Title Pt will be able to improve his Left grip strength to >/= 130 ppsi.    Baseline L grip strength 107 ppsi on 12/06/2020, L grip strength 114ppsi on 01/03/2021.    Status On-going      PT LONG TERM GOAL #4   Title Pt will improve his FOTO score to >/= 61.    Status On-going                 Plan - 01/10/21 1158    Clinical Impression Statement Pt arriving reporting no pain. Pt is scheduled to see Dr. Cleophas Dunker on 01/13/2021 and hopefully will be released for active strengthening exercises. Pt continuing to progress with overall strength and ROM.  Muscle atrophy noted compared to R UE. Pt with one centimeter difference between Right bicep and Left bicep. Continue skilled PT per MD recommendations.    Personal Factors and Comorbidities Comorbidity 2    Comorbidities HTN, migraines, IBS, LBP    Examination-Activity Limitations Dressing;Sleep;Lift;Other    Examination-Participation Restrictions Cleaning;Community Activity;Other;Yard Work    Stability/Clinical Decision Making Stable/Uncomplicated    Rehab Potential Good    PT Frequency Other (comment)    PT Duration 8 weeks    PT Treatment/Interventions ADLs/Self Care Home Management;Cryotherapy;Electrical Stimulation;Therapeutic activities;Functional mobility training;Therapeutic exercise;Neuromuscular re-education;Patient/family education;Manual techniques;Passive range of motion;Vasopneumatic Device;Taping;Ultrasound    PT Home Exercise Plan Access Code: [email or web address]    Consulted and Agree with Plan of Care Patient           Patient will benefit from skilled therapeutic intervention in order to improve the following deficits and impairments:  Pain,Impaired UE functional use,Postural dysfunction,Impaired flexibility,Increased edema,Decreased range of motion,Decreased strength  Visit Diagnosis: Stiffness of left shoulder, not  elsewhere classified  Acute pain of left shoulder  Muscle weakness (generalized)  Localized edema  Abnormal posture     Problem List Patient Active Problem List   Diagnosis Date Noted  . Pain in left shoulder 06/24/2019  . Low back pain 06/24/2019  . Classic migraine with aura 11/11/2013  . Dizziness and giddiness 11/11/2013  . Coital headache 11/11/2013    Sharmon Leyden, PT, MPT 01/10/2021, 12:37 PM  [facility] [address] [place], [place], [address] Phone: [phone]   Fax:  [phone]  Name: Gary Fitzgerald MRN: [id number] Date of Birth: 05/22/1966

## 2021-01-12 ENCOUNTER — Encounter: Payer: BC Managed Care – PPO | Admitting: Physical Therapy

## 2021-01-13 ENCOUNTER — Ambulatory Visit (INDEPENDENT_AMBULATORY_CARE_PROVIDER_SITE_OTHER): Payer: BC Managed Care – PPO | Admitting: Orthopaedic Surgery

## 2021-01-13 ENCOUNTER — Encounter: Payer: Self-pay | Admitting: Orthopaedic Surgery

## 2021-01-13 ENCOUNTER — Other Ambulatory Visit: Payer: Self-pay

## 2021-01-13 VITALS — Ht 69.5 in | Wt 201.0 lb

## 2021-01-13 DIAGNOSIS — G8929 Other chronic pain: Secondary | ICD-10-CM

## 2021-01-13 DIAGNOSIS — M25512 Pain in left shoulder: Secondary | ICD-10-CM

## 2021-01-13 NOTE — Progress Notes (Signed)
Office Visit Note   Patient: Gary Fitzgerald           Date of Birth: 03/19/1966           MRN: [id number] Visit Date: 01/13/2021              Requested by: Ronnald Collum [facility]  [address] [place],  [place] [address] PCP: Coralee Rud, PA-C   Assessment & Plan: Visit Diagnoses:  1. Chronic left shoulder pain     Plan: Approximately 7 weeks status post left shoulder arthroscopic SCD DCR and mini open rotator cuff tear repair.  Doing well in physical therapy 2 times per week.  Passively able to place his arm just about fully overhead and abduct at least 90 degrees.  Little loss of external rotation.  Good grip and release.  Neurologically intact.  I think he is making excellent progress.  He can now progress to active assisted range of motion and then active range of motion increase strengthening exercises.  See him back in a month.  Discontinue the sling  Follow-Up Instructions: Return in about 1 month (around 02/13/2021).   Orders:  No orders of the defined types were placed in this encounter.  No orders of the defined types were placed in this encounter.     Procedures: No procedures performed   Clinical Data: No additional findings.   Subjective: Chief Complaint  Patient presents with  . Left Shoulder - Follow-up    Left shoulder arthroscopy 11/25/2020  Patient presents today for a three week follow up on his left shoulder. He had a left shoulder arthroscopy with SCD, DCR, and mini open rotator cuff tear repair on 11/25/2020. He is now 7 weeks out from surgery. Patient states that he is doing better every day. He is going to physical therapy twice weekly. He is taking tylenol only as needed.   HPI  Review of Systems   Objective: Vital Signs: Ht 5' 9.5" (1.765 m)   Wt 201 lb (91.2 kg)   BMI 29.26 kg/m   Physical Exam  Ortho Exam left shoulder was negative for impingement.  He lacks just a few degrees to full overhead motion  and external rotation.  Biceps intact.  Good grip and release.  Specialty Comments:  No specialty comments available.  Imaging: No results found.   PMFS History: Patient Active Problem List   Diagnosis Date Noted  . Pain in left shoulder 06/24/2019  . Low back pain 06/24/2019  . Classic migraine with aura 11/11/2013  . Dizziness and giddiness 11/11/2013  . Coital headache 11/11/2013   Past Medical History:  Diagnosis Date  . Classic migraine with aura 11/11/2013  . 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 Dizziness   . Headache(784.0)   . Hearing loss    asymmetrical  . History of kidney stones   . Hypertension   . Irritable bowel syndrome   . PONV (postoperative nausea and vomiting)   . Renal calculi    Lithotripsy  . Testosterone deficiency     Family History  Problem Relation Age of Onset  . High blood pressure Mother   . Hypertension Mother   . High blood pressure Father   . Cancer - Prostate Father   . Skin cancer Father   . Hypertension Father   . Hypertension Brother   . Diabetes Brother     Past Surgical History:  Procedure Laterality Date  . ANTERIOR CRUCIATE LIGAMENT REPAIR  x2  . EXTRACORPOREAL SHOCK WAVE LITHOTRIPSY Left 03/05/2017   Procedure: LEFT EXTRACORPOREAL SHOCK WAVE LITHOTRIPSY (ESWL);  Surgeon: Jerilee Field, MD;  Location: [facility];  Service: Urology;  Laterality: Left;  . EXTRACORPOREAL SHOCK WAVE LITHOTRIPSY Right 11/26/2017   Procedure: RIGHT EXTRACORPOREAL SHOCK WAVE LITHOTRIPSY (ESWL);  Surgeon: Rene Paci, MD;  Location: [facility];  Service: Urology;  Laterality: Right;  . EXTRACORPOREAL SHOCK WAVE LITHOTRIPSY Left 12/05/2018   Procedure: EXTRACORPOREAL SHOCK WAVE LITHOTRIPSY (ESWL);  Surgeon: Ihor Gully, MD;  Location: [facility];  Service: Urology;  Laterality: Left;  . EXTRACORPOREAL SHOCK WAVE LITHOTRIPSY Right 07/26/2020   Procedure: EXTRACORPOREAL SHOCK WAVE LITHOTRIPSY (ESWL);  Surgeon: Marcine Matar, MD;  Location: [facility];  Service: Urology;  Laterality: Right;  . EYE SURGERY     lasik surgery bilat   . HIP SURGERY Right   . KNEE SURGERY Left    x2 left knee  . NERVE, TENDON AND ARTERY REPAIR Right 10/14/2019   Procedure: WOUND EXPLORATION REPAIR EXTENSOR TENDON RIGHT THUMB WITH POSSIBLE EXTENSOR INDICUS PROPRIUS TO EXTENSOR POLICUS LONGUS TRANSFER;  Surgeon: Betha Loa, MD;  Location: [facility];  Service: Orthopedics;  Laterality: Right;  . SHOULDER SURGERY     Bilateral  . Thumb surgery     Tendon repair, right   Social History   Occupational History    Comment: [employer]  Tobacco Use  . Smoking status: Never Smoker  . Smokeless tobacco: Never Used  Vaping Use  . Vaping Use: Never used  Substance and Sexual Activity  . Alcohol use: No  . Drug use: Not Currently  . Sexual activity: Not on file

## 2021-01-17 ENCOUNTER — Encounter: Payer: Self-pay | Admitting: Physical Therapy

## 2021-01-17 ENCOUNTER — Other Ambulatory Visit: Payer: Self-pay

## 2021-01-17 ENCOUNTER — Ambulatory Visit (INDEPENDENT_AMBULATORY_CARE_PROVIDER_SITE_OTHER): Payer: BC Managed Care – PPO | Admitting: Physical Therapy

## 2021-01-17 DIAGNOSIS — R600 Localized edema: Secondary | ICD-10-CM

## 2021-01-17 DIAGNOSIS — M25512 Pain in left shoulder: Secondary | ICD-10-CM | POA: Diagnosis not present

## 2021-01-17 DIAGNOSIS — R293 Abnormal posture: Secondary | ICD-10-CM

## 2021-01-17 DIAGNOSIS — M25612 Stiffness of left shoulder, not elsewhere classified: Secondary | ICD-10-CM | POA: Diagnosis not present

## 2021-01-17 DIAGNOSIS — M6281 Muscle weakness (generalized): Secondary | ICD-10-CM

## 2021-01-17 NOTE — Therapy (Signed)
[facility] [address] [place], [place], [address] Phone: [phone]   Fax:  [phone]  Physical Therapy Treatment  Patient Details  Name: Gary Fitzgerald MRN: [id number] Date of Birth: 03/11/1966 Referring Provider (PT): Norlene Campbell MD   Encounter Date: 01/17/2021   PT End of Session - 01/17/21 1215    Visit Number 14    Number of Visits 18    Date for PT Re-Evaluation 01/31/21    Authorization Type 60 visits/year    PT Start Time 1148    PT Stop Time 1230    PT Time Calculation (min) 42 min    Activity Tolerance Patient tolerated treatment well;No increased pain    Behavior During Therapy WFL for tasks assessed/performed           Past Medical History:  Diagnosis Date  . Classic migraine with aura 11/11/2013  . 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 Dizziness   . Headache(784.0)   . Hearing loss    asymmetrical  . History of kidney stones   . Hypertension   . Irritable bowel syndrome   . PONV (postoperative nausea and vomiting)   . Renal calculi    Lithotripsy  . Testosterone deficiency     Past Surgical History:  Procedure Laterality Date  . ANTERIOR CRUCIATE LIGAMENT REPAIR     x2  . EXTRACORPOREAL SHOCK WAVE LITHOTRIPSY Left 03/05/2017   Procedure: LEFT EXTRACORPOREAL SHOCK WAVE LITHOTRIPSY (ESWL);  Surgeon: Jerilee Field, MD;  Location: [facility];  Service: Urology;  Laterality: Left;  . EXTRACORPOREAL SHOCK WAVE LITHOTRIPSY Right 11/26/2017   Procedure: RIGHT EXTRACORPOREAL SHOCK WAVE LITHOTRIPSY (ESWL);  Surgeon: Rene Paci, MD;  Location: [facility];  Service: Urology;  Laterality: Right;  . EXTRACORPOREAL SHOCK WAVE LITHOTRIPSY Left 12/05/2018   Procedure: EXTRACORPOREAL SHOCK WAVE LITHOTRIPSY (ESWL);  Surgeon: Ihor Gully, MD;  Location: [facility];  Service: Urology;  Laterality: Left;  . EXTRACORPOREAL SHOCK WAVE LITHOTRIPSY Right 07/26/2020   Procedure: EXTRACORPOREAL SHOCK WAVE LITHOTRIPSY (ESWL);   Surgeon: Marcine Matar, MD;  Location: [facility];  Service: Urology;  Laterality: Right;  . EYE SURGERY     lasik surgery bilat   . HIP SURGERY Right   . KNEE SURGERY Left    x2 left knee  . NERVE, TENDON AND ARTERY REPAIR Right 10/14/2019   Procedure: WOUND EXPLORATION REPAIR EXTENSOR TENDON RIGHT THUMB WITH POSSIBLE EXTENSOR INDICUS PROPRIUS TO EXTENSOR POLICUS LONGUS TRANSFER;  Surgeon: Betha Loa, MD;  Location: [facility];  Service: Orthopedics;  Laterality: Right;  . SHOULDER SURGERY     Bilateral  . Thumb surgery     Tendon repair, right    There were no vitals filed for this visit.   Subjective Assessment - 01/17/21 1203    Subjective Pt arriving today following his doctor visit with Dr. Cleophas Dunker where he was relased to perform more active and strengthening exercises. Pt reporting more soreness of 3/10 today and feels it may be associated with not wearing his sling and being more active with ADL's using his left UE.    Pertinent History s/p rotator cuff repair  on 11/25/2020, with reattached supraspinatus tendon, AC joint arthritis, LBP, migraines, HTN, IBS    Limitations Lifting;House hold activities;Other (comment)    Patient Stated Goals Get back to work without pain    Pain Score 3     Pain Location Shoulder    Pain Orientation Left    Pain Descriptors / Indicators Sore  Pain Type Surgical pain    Pain Onset More than a month ago                             [facility] Adult PT Treatment/Exercise - 01/17/21 0001      Exercises   Exercises Shoulder      Shoulder Exercises: Standing   Internal Rotation Strengthening;Left;15 reps;Theraband    Theraband Level (Shoulder Internal Rotation) Level 1 (Yellow)    Flexion Strengthening;Left;15 reps;Theraband    Theraband Level (Shoulder Flexion) Level 1 (Yellow)    Extension Strengthening;Both;Theraband    Theraband Level (Shoulder Extension) Level 2 (Red)    Row  Strengthening;Both;20 reps;Theraband    Theraband Level (Shoulder Row) Level 2 (Red)    Other Standing Exercises ball rolls up the wall x 10 (red physioball)      Shoulder Exercises: ROM/Strengthening   UBE (Upper Arm Bike) L1.5: alternating forward and back x 4 minutes      Modalities   Modalities Vasopneumatic      Vasopneumatic   Number Minutes Vasopneumatic  10 minutes    Vasopnuematic Location  Shoulder    Vasopneumatic Pressure Medium    Vasopneumatic Temperature  34      Manual Therapy   Manual therapy comments grade 2-3 GH AP mobs                    PT Short Term Goals - 01/10/21 1203      PT SHORT TERM GOAL #1   Title Pt will be independent in his HEP.    Status Achieved      PT SHORT TERM GOAL #2   Title Pt will be able to tolreate left shoulder flexion >/= 90 degrees passively.    Status Achieved             PT Long Term Goals - 01/17/21 1221      PT LONG TERM GOAL #1   Title Pt will be albe to improve his L shoulder flexion to >/= 150 degrees.    Status On-going      PT LONG TERM GOAL #2   Title Pt will improve his left shoulder External Rotation to >/= 70 degrees.    Status On-going      PT LONG TERM GOAL #3   Title Pt will be able to improve his Left grip strength to >/= 130 ppsi.    Baseline L grip strength 107 ppsi on 12/06/2020, L grip strength 114ppsi on 01/03/2021.    Status On-going      PT LONG TERM GOAL #4   Title Pt will improve his FOTO score to >/= 61.    Baseline FOTO score of 30 on 12/06/2020    Status On-going                 Plan - 01/17/21 1216    Clinical Impression Statement Pt arriving to therapy reporting 3/10 pain in his left shoulder. Pt saw Dr. Cleophas Dunker on 01/13/2021 and he was released from wearing the sling and is now able to performed active and strengthening exercises. Continue to progress as pt tolerates toward his PLOF.    Personal Factors and Comorbidities Comorbidity 2    Comorbidities HTN,  migraines, IBS, LBP    Examination-Activity Limitations Dressing;Sleep;Lift;Other    Examination-Participation Restrictions Cleaning;Community Activity;Other;Yard Work    Stability/Clinical Decision Making Stable/Uncomplicated    Rehab Potential Good    PT Frequency Other (  comment)    PT Duration 8 weeks    PT Treatment/Interventions ADLs/Self Care Home Management;Cryotherapy;Electrical Stimulation;Therapeutic activities;Functional mobility training;Therapeutic exercise;Neuromuscular re-education;Patient/family education;Manual techniques;Passive range of motion;Vasopneumatic Device;Taping;Ultrasound    PT Next Visit Plan Pt released to being L shoulder active ROM and strengthening    PT Home Exercise Plan Access Code: [email or web address]    Consulted and Agree with Plan of Care Patient           Patient will benefit from skilled therapeutic intervention in order to improve the following deficits and impairments:  Pain,Impaired UE functional use,Postural dysfunction,Impaired flexibility,Increased edema,Decreased range of motion,Decreased strength  Visit Diagnosis: Stiffness of left shoulder, not elsewhere classified  Acute pain of left shoulder  Muscle weakness (generalized)  Localized edema  Abnormal posture     Problem List Patient Active Problem List   Diagnosis Date Noted  . Pain in left shoulder 06/24/2019  . Low back pain 06/24/2019  . Classic migraine with aura 11/11/2013  . Dizziness and giddiness 11/11/2013  . Coital headache 11/11/2013    Sharmon Leyden, PT, MPT 01/17/2021, 12:27 PM  [facility] [address] [place], [place], [address] Phone: [phone]   Fax:  [phone]  Name: DEONDRA WIGGER MRN: [id number] Date of Birth: 12/22/1965

## 2021-01-19 ENCOUNTER — Other Ambulatory Visit: Payer: Self-pay

## 2021-01-19 ENCOUNTER — Ambulatory Visit (INDEPENDENT_AMBULATORY_CARE_PROVIDER_SITE_OTHER): Payer: BC Managed Care – PPO | Admitting: Physical Therapy

## 2021-01-19 ENCOUNTER — Encounter: Payer: Self-pay | Admitting: Physical Therapy

## 2021-01-19 DIAGNOSIS — R600 Localized edema: Secondary | ICD-10-CM | POA: Diagnosis not present

## 2021-01-19 DIAGNOSIS — R293 Abnormal posture: Secondary | ICD-10-CM

## 2021-01-19 DIAGNOSIS — M25512 Pain in left shoulder: Secondary | ICD-10-CM | POA: Diagnosis not present

## 2021-01-19 DIAGNOSIS — M25612 Stiffness of left shoulder, not elsewhere classified: Secondary | ICD-10-CM

## 2021-01-19 DIAGNOSIS — M6281 Muscle weakness (generalized): Secondary | ICD-10-CM | POA: Diagnosis not present

## 2021-01-19 NOTE — Therapy (Signed)
[facility] Physical Therapy [address] [place], [place], [address] Phone: [phone]   Fax:  [phone]  Physical Therapy Treatment  Patient Details  Name: Gary Fitzgerald MRN: [id number] Date of Birth: 1966-07-30 Referring Provider (PT): Norlene Campbell MD   Encounter Date: 01/19/2021   PT End of Session - 01/19/21 1150    Visit Number 15    Number of Visits 18    Date for PT Re-Evaluation 01/31/21    Authorization Type 60 visits/year    PT Start Time 1103    PT Stop Time 1152    PT Time Calculation (min) 49 min    Activity Tolerance Patient tolerated treatment well;No increased pain    Behavior During Therapy WFL for tasks assessed/performed           Past Medical History:  Diagnosis Date  . Classic migraine with aura 11/11/2013  . 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 Dizziness   . Headache(784.0)   . Hearing loss    asymmetrical  . History of kidney stones   . Hypertension   . Irritable bowel syndrome   . PONV (postoperative nausea and vomiting)   . Renal calculi    Lithotripsy  . Testosterone deficiency     Past Surgical History:  Procedure Laterality Date  . ANTERIOR CRUCIATE LIGAMENT REPAIR     x2  . EXTRACORPOREAL SHOCK WAVE LITHOTRIPSY Left 03/05/2017   Procedure: LEFT EXTRACORPOREAL SHOCK WAVE LITHOTRIPSY (ESWL);  Surgeon: Jerilee Field, MD;  Location: [facility];  Service: Urology;  Laterality: Left;  . EXTRACORPOREAL SHOCK WAVE LITHOTRIPSY Right 11/26/2017   Procedure: RIGHT EXTRACORPOREAL SHOCK WAVE LITHOTRIPSY (ESWL);  Surgeon: Rene Paci, MD;  Location: [facility];  Service: Urology;  Laterality: Right;  . EXTRACORPOREAL SHOCK WAVE LITHOTRIPSY Left 12/05/2018   Procedure: EXTRACORPOREAL SHOCK WAVE LITHOTRIPSY (ESWL);  Surgeon: Ihor Gully, MD;  Location: [facility];  Service: Urology;  Laterality: Left;  . EXTRACORPOREAL SHOCK WAVE LITHOTRIPSY Right 07/26/2020   Procedure: EXTRACORPOREAL SHOCK WAVE LITHOTRIPSY (ESWL);   Surgeon: Marcine Matar, MD;  Location: [facility];  Service: Urology;  Laterality: Right;  . EYE SURGERY     lasik surgery bilat   . HIP SURGERY Right   . KNEE SURGERY Left    x2 left knee  . NERVE, TENDON AND ARTERY REPAIR Right 10/14/2019   Procedure: WOUND EXPLORATION REPAIR EXTENSOR TENDON RIGHT THUMB WITH POSSIBLE EXTENSOR INDICUS PROPRIUS TO EXTENSOR POLICUS LONGUS TRANSFER;  Surgeon: Betha Loa, MD;  Location: [facility];  Service: Orthopedics;  Laterality: Right;  . SHOULDER SURGERY     Bilateral  . Thumb surgery     Tendon repair, right    There were no vitals filed for this visit.   Subjective Assessment - 01/19/21 1149    Subjective Pt arriving today rporting 2/10 pain in his left shoulder.    Pertinent History s/p rotator cuff repair  on 11/25/2020, with reattached supraspinatus tendon, AC joint arthritis, LBP, migraines, HTN, IBS    Limitations Lifting;House hold activities;Other (comment)    How long can you sit comfortably? unlimited    How long can you stand comfortably? unlimited    How long can you walk comfortably? unlimited    Diagnostic tests X-ray    Patient Stated Goals Get back to work without pain    Currently in Pain? Yes    Pain Score 2     Pain Location Shoulder    Pain Orientation Left    Pain Descriptors / Indicators  Tightness;Sore    Pain Onset More than a month ago                             [facility] Adult PT Treatment/Exercise - 01/19/21 0001      Exercises   Exercises Shoulder      Shoulder Exercises: Supine   Flexion AROM;Left;10 reps;Weights    Flexion Limitations with 3# bar x 15 AAROM      Shoulder Exercises: Standing   Internal Rotation Strengthening;Left;10 reps;Theraband   3 sets   Theraband Level (Shoulder Internal Rotation) Level 2 (Red)    Flexion Strengthening;Left;10 reps;Theraband    Theraband Level (Shoulder Flexion) Level 2 (Red)    Extension Strengthening;10  reps;Theraband   3 sets   Theraband Level (Shoulder Extension) Level 2 (Red)    Row Strengthening;Theraband    Theraband Level (Shoulder Row) Level 3 (Green)      Shoulder Exercises: ROM/Strengthening   UBE (Upper Arm Bike) L2 6 minutes alteranting every minute    Other ROM/Strengthening Exercises BATCA rows 10# bialteral UE's    Other ROM/Strengthening Exercises body blade: elbow bent to 90 degrees 2x 20 seconds      Modalities   Modalities Vasopneumatic      Vasopneumatic   Number Minutes Vasopneumatic  10 minutes    Vasopnuematic Location  Shoulder    Vasopneumatic Pressure Medium    Vasopneumatic Temperature  34      Manual Therapy   Manual therapy comments grade 2-3 GH AP mobs, percussion to left anterior shoulder                    PT Short Term Goals - 01/10/21 1203      PT SHORT TERM GOAL #1   Title Pt will be independent in his HEP.    Status Achieved      PT SHORT TERM GOAL #2   Title Pt will be able to tolreate left shoulder flexion >/= 90 degrees passively.    Status Achieved             PT Long Term Goals - 01/19/21 1158      PT LONG TERM GOAL #1   Title Pt will be albe to improve his L shoulder flexion to >/= 150 degrees.    Status On-going      PT LONG TERM GOAL #2   Title Pt will improve his left shoulder External Rotation to >/= 70 degrees.    Status On-going      PT LONG TERM GOAL #3   Title Pt will be able to improve his Left grip strength to >/= 130 ppsi.    Status On-going      PT LONG TERM GOAL #4   Title Pt will improve his FOTO score to >/= 61.    Status On-going                 Plan - 01/19/21 1151    Clinical Impression Statement Pt arriving today reporting 2/10 pain in left shoulder. Pt reporting he is able to sleep through the night. Pt with increasd pain with active flexion in standing position. Pt's pain increased to 5/10. Following shoulder mobs and percussion pt's pain decreased back to 2/10 by end of  session. Continue with skilled PT progressing strengthening and AROM as pt tolerates.    Personal Factors and Comorbidities Comorbidity 2    Comorbidities HTN, migraines, IBS, LBP  Examination-Activity Limitations Dressing;Sleep;Lift;Other    Examination-Participation Restrictions Cleaning;Community Activity;Other;Yard Work    Stability/Clinical Decision Making Stable/Uncomplicated    Rehab Potential Good    PT Duration 8 weeks    PT Treatment/Interventions ADLs/Self Care Home Management;Cryotherapy;Electrical Stimulation;Therapeutic activities;Functional mobility training;Therapeutic exercise;Neuromuscular re-education;Patient/family education;Manual techniques;Passive range of motion;Vasopneumatic Device;Taping;Ultrasound    PT Next Visit Plan Pt released to being L shoulder active ROM and strengthening    PT Home Exercise Plan Access Code: [email or web address]    Consulted and Agree with Plan of Care Patient           Patient will benefit from skilled therapeutic intervention in order to improve the following deficits and impairments:  Pain,Impaired UE functional use,Postural dysfunction,Impaired flexibility,Increased edema,Decreased range of motion,Decreased strength  Visit Diagnosis: Stiffness of left shoulder, not elsewhere classified  Acute pain of left shoulder  Muscle weakness (generalized)  Localized edema  Abnormal posture     Problem List Patient Active Problem List   Diagnosis Date Noted  . Pain in left shoulder 06/24/2019  . Low back pain 06/24/2019  . Classic migraine with aura 11/11/2013  . Dizziness and giddiness 11/11/2013  . Coital headache 11/11/2013    Sharmon Leyden, PT, MPT 01/19/2021, 12:00 PM  [facility] Physical Therapy [address] [place], [place], [address] Phone: [phone]   Fax:  [phone]  Name: Gary Fitzgerald MRN: [id number] Date of Birth: Jul 08, 1966

## 2021-01-24 ENCOUNTER — Other Ambulatory Visit: Payer: Self-pay

## 2021-01-24 ENCOUNTER — Ambulatory Visit (INDEPENDENT_AMBULATORY_CARE_PROVIDER_SITE_OTHER): Payer: BC Managed Care – PPO | Admitting: Physical Therapy

## 2021-01-24 ENCOUNTER — Encounter: Payer: Self-pay | Admitting: Physical Therapy

## 2021-01-24 DIAGNOSIS — M6281 Muscle weakness (generalized): Secondary | ICD-10-CM

## 2021-01-24 DIAGNOSIS — M25512 Pain in left shoulder: Secondary | ICD-10-CM

## 2021-01-24 DIAGNOSIS — M25612 Stiffness of left shoulder, not elsewhere classified: Secondary | ICD-10-CM | POA: Diagnosis not present

## 2021-01-24 NOTE — Therapy (Signed)
[facility] Physical Therapy [address] [place], [place], [address] Phone: [phone]   Fax:  [phone]  Physical Therapy Treatment  Patient Details  Name: Gary Fitzgerald MRN: [id number] Date of Birth: [birth year] Referring Provider (PT): Norlene Campbell MD   Encounter Date: 01/24/2021   PT End of Session - 01/24/21 1203    Visit Number 16    Number of Visits 18    Date for PT Re-Evaluation 01/31/21    Authorization Type 60 visits/year    PT Start Time 1150    PT Stop Time 1235    PT Time Calculation (min) 45 min    Activity Tolerance Patient tolerated treatment well;No increased pain    Behavior During Therapy WFL for tasks assessed/performed           Past Medical History:  Diagnosis Date  . Classic migraine with aura 11/11/2013  . 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 Dizziness   . Headache(784.0)   . Hearing loss    asymmetrical  . History of kidney stones   . Hypertension   . Irritable bowel syndrome   . PONV (postoperative nausea and vomiting)   . Renal calculi    Lithotripsy  . Testosterone deficiency     Past Surgical History:  Procedure Laterality Date  . ANTERIOR CRUCIATE LIGAMENT REPAIR     x2  . EXTRACORPOREAL SHOCK WAVE LITHOTRIPSY Left 03/05/2017   Procedure: LEFT EXTRACORPOREAL SHOCK WAVE LITHOTRIPSY (ESWL);  Surgeon: Jerilee Field, MD;  [facility];  Service: Urology;  Laterality: Left;  . EXTRACORPOREAL SHOCK WAVE LITHOTRIPSY Right 11/26/2017   Procedure: RIGHT EXTRACORPOREAL SHOCK WAVE LITHOTRIPSY (ESWL);  Surgeon: Rene Paci, MD;  [facility];  Service: Urology;  Laterality: Right;  . EXTRACORPOREAL SHOCK WAVE LITHOTRIPSY Left 12/05/2018   Procedure: EXTRACORPOREAL SHOCK WAVE LITHOTRIPSY (ESWL);  Surgeon: Ihor Gully, MD;  [facility];  Service: Urology;  Laterality: Left;  . EXTRACORPOREAL SHOCK WAVE LITHOTRIPSY Right 07/26/2020   Procedure: EXTRACORPOREAL SHOCK WAVE LITHOTRIPSY (ESWL);   Surgeon: Marcine Matar, MD;  Location: [facility];  Service: Urology;  Laterality: Right;  . EYE SURGERY     lasik surgery bilat   . HIP SURGERY Right   . KNEE SURGERY Left    x2 left knee  . NERVE, TENDON AND ARTERY REPAIR Right 10/14/2019   Procedure: WOUND EXPLORATION REPAIR EXTENSOR TENDON RIGHT THUMB WITH POSSIBLE EXTENSOR INDICUS PROPRIUS TO EXTENSOR POLICUS LONGUS TRANSFER;  Surgeon: Betha Loa, MD;  Location: [facility];  Service: Orthopedics;  Laterality: Right;  . SHOULDER SURGERY     Bilateral  . Thumb surgery     Tendon repair, right    There were no vitals filed for this visit.   Subjective Assessment - 01/24/21 1202    Subjective Pt arriving today reporting 2/10 pain in his left shoulder. Pt reproting doing some yard/garden work on his tractor over the weekend.    Pertinent History s/p rotator cuff repair  on 11/25/2020, with reattached supraspinatus tendon, AC joint arthritis, LBP, migraines, HTN, IBS    Limitations Lifting;House hold activities;Other (comment)    How long can you sit comfortably? unlimited    How long can you stand comfortably? unlimited    How long can you walk comfortably? unlimited    Diagnostic tests X-ray    Patient Stated Goals Get back to work without pain    Currently in Pain? Yes    Pain Score 2     Pain Location Shoulder  Pain Orientation Left    Pain Descriptors / Indicators Tightness;Sore    Pain Type Surgical pain    Pain Onset More than a month ago                             [facility] Adult PT Treatment/Exercise - 01/24/21 0001      Exercises   Exercises Shoulder      Shoulder Exercises: Supine   Flexion AROM;Left;10 reps;Weights    Flexion Limitations 2#      Shoulder Exercises: Sidelying   External Rotation Strengthening;Left;15 reps;Weights    External Rotation Weight (lbs) 2#    ABduction Strengthening;Left;15 reps      Shoulder Exercises: Standing   Internal  Rotation Strengthening;Left;10 reps;Theraband   3 sets   Theraband Level (Shoulder Internal Rotation) Level 3 (Green)    Flexion Strengthening;Left;10 reps;Theraband    Theraband Level (Shoulder Flexion) Level 2 (Red)    Extension Strengthening;10 reps;Theraband   3 sets   Theraband Level (Shoulder Extension) Level 3 (Green)      Shoulder Exercises: ROM/Strengthening   UBE (Upper Arm Bike) 8 minutes total, 4 minutes forward and 4 minutes back    Other ROM/Strengthening Exercises BATCA rows  15 # 3x15, BATCA pull downs x 15# 2x10      Modalities   Modalities Vasopneumatic      Vasopneumatic   Number Minutes Vasopneumatic  10 minutes    Vasopnuematic Location  Shoulder    Vasopneumatic Pressure Medium    Vasopneumatic Temperature  34                    PT Short Term Goals - 01/10/21 1203      PT SHORT TERM GOAL #1   Title Pt will be independent in his HEP.    Status Achieved      PT SHORT TERM GOAL #2   Title Pt will be able to tolreate left shoulder flexion >/= 90 degrees passively.    Status Achieved             PT Long Term Goals - 01/24/21 1210      PT LONG TERM GOAL #1   Title Pt will be albe to improve his L shoulder flexion to >/= 150 degrees.    Status On-going      PT LONG TERM GOAL #2   Title Pt will improve his left shoulder External Rotation to >/= 70 degrees.    Status On-going      PT LONG TERM GOAL #4   Title Pt will improve his FOTO score to >/= 61.    Status On-going      PT LONG TERM GOAL #5   Status On-going                 Plan - 01/24/21 1205    Clinical Impression Statement Pt arriving today reporting 2/10 pain in left shoulder more tighenss and soreness. Pt reporting sleeping better at night. Pt also reporting more active with household chores and ADL's. Progress with active flexion and abduction strength. Will need recert at next visit for additional visits to progress left UE strengthening. Continue skilled PT.     Personal Factors and Comorbidities Comorbidity 2    Comorbidities HTN, migraines, IBS, LBP    Examination-Activity Limitations Dressing;Sleep;Lift;Other    Examination-Participation Restrictions Cleaning;Community Activity;Other;Yard Work    Stability/Clinical Decision Making Stable/Uncomplicated    Rehab Potential Good  PT Frequency Other (comment)    PT Duration 8 weeks    PT Treatment/Interventions ADLs/Self Care Home Management;Cryotherapy;Electrical Stimulation;Therapeutic activities;Functional mobility training;Therapeutic exercise;Neuromuscular re-education;Patient/family education;Manual techniques;Passive range of motion;Vasopneumatic Device;Taping;Ultrasound    PT Next Visit Plan Re-cert for additoinal visits next visit. Pt released to being L shoulder active ROM and strengthening    PT Home Exercise Plan Access Code: [email or web address]    Consulted and Agree with Plan of Care Patient           Patient will benefit from skilled therapeutic intervention in order to improve the following deficits and impairments:  Pain,Impaired UE functional use,Postural dysfunction,Impaired flexibility,Increased edema,Decreased range of motion,Decreased strength  Visit Diagnosis: Stiffness of left shoulder, not elsewhere classified  Muscle weakness (generalized)  Acute pain of left shoulder     Problem List Patient Active Problem List   Diagnosis Date Noted  . Pain in left shoulder 06/24/2019  . Low back pain 06/24/2019  . Classic migraine with aura 11/11/2013  . Dizziness and giddiness 11/11/2013  . Coital headache 11/11/2013    Sharmon Leyden, PT, MPT 01/24/2021, 12:30 PM  [facility] Physical Therapy [address] [place], [place], [address] Phone: [phone]   Fax:  [phone]  Name: Gary Fitzgerald MRN: [id number] Date of Birth: 1966/03/11

## 2021-01-26 ENCOUNTER — Other Ambulatory Visit: Payer: Self-pay

## 2021-01-26 ENCOUNTER — Encounter: Payer: Self-pay | Admitting: Physical Therapy

## 2021-01-26 ENCOUNTER — Ambulatory Visit (INDEPENDENT_AMBULATORY_CARE_PROVIDER_SITE_OTHER): Payer: BC Managed Care – PPO | Admitting: Physical Therapy

## 2021-01-26 DIAGNOSIS — M25512 Pain in left shoulder: Secondary | ICD-10-CM | POA: Diagnosis not present

## 2021-01-26 DIAGNOSIS — R600 Localized edema: Secondary | ICD-10-CM

## 2021-01-26 DIAGNOSIS — M25612 Stiffness of left shoulder, not elsewhere classified: Secondary | ICD-10-CM

## 2021-01-26 DIAGNOSIS — M6281 Muscle weakness (generalized): Secondary | ICD-10-CM | POA: Diagnosis not present

## 2021-01-26 DIAGNOSIS — R293 Abnormal posture: Secondary | ICD-10-CM

## 2021-01-26 NOTE — Therapy (Signed)
[facility] Physical Therapy [address] [place], [place], [address] Phone: [phone]   Fax:  [phone]  Physical Therapy Treatment Re-assessment  Patient Details  Name: Gary Fitzgerald MRN: [id number] Date of Birth: Mar 05, 1966 Referring Provider (PT): Norlene Campbell MD   Encounter Date: 01/26/2021   PT End of Session - 01/26/21 1128    Visit Number 17    Number of Visits 30    Date for PT Re-Evaluation 01/31/21    Authorization Type 60 visits/year    Authorization Time Period Recert sent for additional 8 weeks and 12 visits on 01/26/2021    PT Start Time 1102    PT Stop Time 1150    PT Time Calculation (min) 48 min    Activity Tolerance Patient tolerated treatment well;No increased pain    Behavior During Therapy WFL for tasks assessed/performed           Past Medical History:  Diagnosis Date  . Classic migraine with aura 11/11/2013  . 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 Dizziness   . Headache(784.0)   . Hearing loss    asymmetrical  . History of kidney stones   . Hypertension   . Irritable bowel syndrome   . PONV (postoperative nausea and vomiting)   . Renal calculi    Lithotripsy  . Testosterone deficiency     Past Surgical History:  Procedure Laterality Date  . ANTERIOR CRUCIATE LIGAMENT REPAIR     x2  . EXTRACORPOREAL SHOCK WAVE LITHOTRIPSY Left 03/05/2017   Procedure: LEFT EXTRACORPOREAL SHOCK WAVE LITHOTRIPSY (ESWL);  Surgeon: Jerilee Field, MD;  [facility];  Service: Urology;  Laterality: Left;  . EXTRACORPOREAL SHOCK WAVE LITHOTRIPSY Right 11/26/2017   Procedure: RIGHT EXTRACORPOREAL SHOCK WAVE LITHOTRIPSY (ESWL);  Surgeon: Rene Paci, MD;  [facility];  Service: Urology;  Laterality: Right;  . EXTRACORPOREAL SHOCK WAVE LITHOTRIPSY Left 12/05/2018   Procedure: EXTRACORPOREAL SHOCK WAVE LITHOTRIPSY (ESWL);  Surgeon: Ihor Gully, MD;  [facility];  Service: Urology;  Laterality: Left;  .  EXTRACORPOREAL SHOCK WAVE LITHOTRIPSY Right 07/26/2020   Procedure: EXTRACORPOREAL SHOCK WAVE LITHOTRIPSY (ESWL);  Surgeon: Marcine Matar, MD;  Location: [facility];  Service: Urology;  Laterality: Right;  . EYE SURGERY     lasik surgery bilat   . HIP SURGERY Right   . KNEE SURGERY Left    x2 left knee  . NERVE, TENDON AND ARTERY REPAIR Right 10/14/2019   Procedure: WOUND EXPLORATION REPAIR EXTENSOR TENDON RIGHT THUMB WITH POSSIBLE EXTENSOR INDICUS PROPRIUS TO EXTENSOR POLICUS LONGUS TRANSFER;  Surgeon: Betha Loa, MD;  Location: [facility];  Service: Orthopedics;  Laterality: Right;  . SHOULDER SURGERY     Bilateral  . Thumb surgery     Tendon repair, right    There were no vitals filed for this visit.   Subjective Assessment - 01/26/21 1128    Subjective Pt arriving today reporting no pain in his left shoulder.    Pertinent History s/p rotator cuff repair  on 11/25/2020, with reattached supraspinatus tendon, AC joint arthritis, LBP, migraines, HTN, IBS    Limitations Lifting;House hold activities;Other (comment)    Diagnostic tests X-ray    Patient Stated Goals Get back to work without pain    Currently in Pain? No/denies              [facility] PT Assessment - 01/26/21 0001      Assessment   Medical Diagnosis L arthroscopy    Referring Provider (PT) Cleophas Dunker,  Peter MD    Onset Date/Surgical Date 11/25/20    Hand Dominance Left      AROM   Left Shoulder Extension 55 Degrees    Left Shoulder Flexion 158 Degrees    Left Shoulder ABduction 135 Degrees    Left Shoulder Internal Rotation 76 Degrees    Left Shoulder External Rotation 70 Degrees      PROM   Left Shoulder Extension 60 Degrees   measured standing   Left Shoulder Flexion 164 Degrees   measured in supine   Left Shoulder ABduction 140 Degrees   measured in supine   Left Shoulder Internal Rotation 80 Degrees   measured in supine   Left Shoulder External Rotation 76 Degrees    measured in supine                        [facility] Adult PT Treatment/Exercise - 01/26/21 0001      Exercises   Exercises Shoulder      Shoulder Exercises: Sidelying   External Rotation Strengthening;Left;15 reps;Weights    External Rotation Weight (lbs) 3#    ABduction Strengthening;Left;10 reps   2x10     Shoulder Exercises: Standing   Internal Rotation Strengthening;Left;10 reps;Theraband   3 sets   Theraband Level (Shoulder Internal Rotation) Level 3 (Green)    Flexion Strengthening;Left;10 reps;Theraband   3 x 10   Theraband Level (Shoulder Flexion) Level 2 (Red)    Extension Strengthening;10 reps;Theraband   3 sets   Theraband Level (Shoulder Extension) Level 3 (Green)      Shoulder Exercises: ROM/Strengthening   UBE (Upper Arm Bike) 6 minutes total: 3 minutes forward and back    Other ROM/Strengthening Exercises BATCA Row: 15# 3x10      Modalities   Modalities Vasopneumatic      Vasopneumatic   Number Minutes Vasopneumatic  10 minutes    Vasopnuematic Location  Shoulder    Vasopneumatic Pressure Medium    Vasopneumatic Temperature  34                    PT Short Term Goals - 01/10/21 1203      PT SHORT TERM GOAL #1   Title Pt will be independent in his HEP.    Status Achieved      PT SHORT TERM GOAL #2   Title Pt will be able to tolreate left shoulder flexion >/= 90 degrees passively.    Status Achieved             PT Long Term Goals - 01/26/21 1145      PT LONG TERM GOAL #1   Title Pt will be albe to improve his L shoulder flexion to >/= 150 degrees.    Status Achieved      PT LONG TERM GOAL #2   Title Pt will improve his left shoulder External Rotation to >/= 70 degrees.    Status Achieved      PT LONG TERM GOAL #3   Title Pt will be able to improve his Left grip strength to >/= 130 ppsi.    Baseline L grip strength 107 ppsi on 12/06/2020, L grip strength 114ppsi on 01/03/2021, L grip strength: 114 ppsi on 01/26/2021.      PT  LONG TERM GOAL #4   Title Pt will improve his FOTO score to >/= 61.    Baseline FOTO score of 30 on 12/06/2020    Status On-going  PT LONG TERM GOAL #5   Title Pt will improve his left shoulder abduction to >/= 165 degrees with no pain reported.    Status New    Target Date 03/11/21      Additional Long Term Goals   Additional Long Term Goals Yes      PT LONG TERM GOAL #7   Title Pt will improve left shoulder  strength to >/= 4+/5.    Time 6    Status New                 Plan - 01/26/21 1158    Clinical Impression Statement Pt arriving today reporting no pain. Pt is able to complete basic ADL's with minimal pain of 3/10 or less. Pt reporting pain with overhead reaching activities. Pt progressing with left shoulder strengthening as well as increased flexion,  abduction and external rotation. Pt works in [employer] and his goal is return to his PLOF. Pt has made excellent progress with PROM: flexion 164 degrees, abduction 140 degrees, IR 80 degrees, ER 76 degrees, extension 60 degrees. I am requesting 8 additional weeks of therapy 1-2x/ week to progress with strengthing and returning AROM equal to his right upper extremity.    Personal Factors and Comorbidities Comorbidity 2    Comorbidities HTN, migraines, IBS, LBP    Examination-Activity Limitations Dressing;Sleep;Lift;Other    Examination-Participation Restrictions Cleaning;Community Activity;Other;Yard Work    Stability/Clinical Decision Making Stable/Uncomplicated    Rehab Potential Good    PT Frequency Other (comment)   1-2 x week   PT Duration 6 weeks    PT Treatment/Interventions ADLs/Self Care Home Management;Cryotherapy;Electrical Stimulation;Therapeutic activities;Functional mobility training;Therapeutic exercise;Neuromuscular re-education;Patient/family education;Manual techniques;Passive range of motion;Vasopneumatic Device;Taping;Ultrasound    PT Next Visit Plan Pt released to being L shoulder active ROM and  strengthening    PT Home Exercise Plan Access Code: [email or web address]    Consulted and Agree with Plan of Care Patient           Patient will benefit from skilled therapeutic intervention in order to improve the following deficits and impairments:  Pain,Impaired UE functional use,Postural dysfunction,Impaired flexibility,Increased edema,Decreased range of motion,Decreased strength  Visit Diagnosis: Stiffness of left shoulder, not elsewhere classified  Muscle weakness (generalized)  Acute pain of left shoulder  Localized edema  Abnormal posture     Problem List Patient Active Problem List   Diagnosis Date Noted  . Pain in left shoulder 06/24/2019  . Low back pain 06/24/2019  . Classic migraine with aura 11/11/2013  . Dizziness and giddiness 11/11/2013  . Coital headache 11/11/2013    Sharmon Leyden, PT, MPT 01/26/2021, 12:06 PM  [facility] Physical Therapy [address] [place], [place], [address] Phone: [phone]   Fax:  [phone]  Name: Gary Fitzgerald MRN: [id number] Date of Birth: 1966/05/04

## 2021-01-31 ENCOUNTER — Encounter: Payer: Self-pay | Admitting: Physical Therapy

## 2021-01-31 ENCOUNTER — Other Ambulatory Visit: Payer: Self-pay

## 2021-01-31 ENCOUNTER — Ambulatory Visit (INDEPENDENT_AMBULATORY_CARE_PROVIDER_SITE_OTHER): Payer: BC Managed Care – PPO | Admitting: Physical Therapy

## 2021-01-31 DIAGNOSIS — M25512 Pain in left shoulder: Secondary | ICD-10-CM

## 2021-01-31 DIAGNOSIS — R293 Abnormal posture: Secondary | ICD-10-CM

## 2021-01-31 DIAGNOSIS — M25612 Stiffness of left shoulder, not elsewhere classified: Secondary | ICD-10-CM

## 2021-01-31 DIAGNOSIS — R600 Localized edema: Secondary | ICD-10-CM | POA: Diagnosis not present

## 2021-01-31 DIAGNOSIS — M6281 Muscle weakness (generalized): Secondary | ICD-10-CM

## 2021-01-31 NOTE — Therapy (Signed)
[facility] Physical Therapy [address] [place], [place], [address] Phone: [phone]   Fax:  [phone]  Physical Therapy Treatment  Patient Details  Name: Gary Fitzgerald MRN: [id number] Date of Birth: [birth year] Referring Provider (PT): Norlene Campbell MD   Encounter Date: 01/31/2021   PT End of Session - 01/31/21 1209    Visit Number 18    Number of Visits 30    Date for PT Re-Evaluation 01/31/21    Authorization Type 60 visits/year    Authorization Time Period Recert sent for additional 8 weeks and 12 visits on 01/26/2021    PT Start Time 1150    PT Stop Time 1230    PT Time Calculation (min) 40 min    Activity Tolerance Patient tolerated treatment well;No increased pain    Behavior During Therapy WFL for tasks assessed/performed           Past Medical History:  Diagnosis Date  . Classic migraine with aura 11/11/2013  . 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 Dizziness   . Headache(784.0)   . Hearing loss    asymmetrical  . History of kidney stones   . Hypertension   . Irritable bowel syndrome   . PONV (postoperative nausea and vomiting)   . Renal calculi    Lithotripsy  . Testosterone deficiency     Past Surgical History:  Procedure Laterality Date  . ANTERIOR CRUCIATE LIGAMENT REPAIR     x2  . EXTRACORPOREAL SHOCK WAVE LITHOTRIPSY Left 03/05/2017   Procedure: LEFT EXTRACORPOREAL SHOCK WAVE LITHOTRIPSY (ESWL);  Surgeon: Jerilee Field, MD;  Location: [facility];  Service: Urology;  Laterality: Left;  . EXTRACORPOREAL SHOCK WAVE LITHOTRIPSY Right 11/26/2017   Procedure: RIGHT EXTRACORPOREAL SHOCK WAVE LITHOTRIPSY (ESWL);  Surgeon: Rene Paci, MD;  Location: [facility];  Service: Urology;  Laterality: Right;  . EXTRACORPOREAL SHOCK WAVE LITHOTRIPSY Left 12/05/2018   Procedure: EXTRACORPOREAL SHOCK WAVE LITHOTRIPSY (ESWL);  Surgeon: Ihor Gully, MD;  Location: [facility];  Service: Urology;  Laterality: Left;  . EXTRACORPOREAL SHOCK WAVE  LITHOTRIPSY Right 07/26/2020   Procedure: EXTRACORPOREAL SHOCK WAVE LITHOTRIPSY (ESWL);  Surgeon: Marcine Matar, MD;  Location: [facility];  Service: Urology;  Laterality: Right;  . EYE SURGERY     lasik surgery bilat   . HIP SURGERY Right   . KNEE SURGERY Left    x2 left knee  . NERVE, TENDON AND ARTERY REPAIR Right 10/14/2019   Procedure: WOUND EXPLORATION REPAIR EXTENSOR TENDON RIGHT THUMB WITH POSSIBLE EXTENSOR INDICUS PROPRIUS TO EXTENSOR POLICUS LONGUS TRANSFER;  Surgeon: Betha Loa, MD;  Location: [facility];  Service: Orthopedics;  Laterality: Right;  . SHOULDER SURGERY     Bilateral  . Thumb surgery     Tendon repair, right    There were no vitals filed for this visit.   Subjective Assessment - 01/31/21 1154    Subjective Pt reporting  he was sore following attempting lat pull downs at last session. Pt reporting 3/10 pain today with movements and stretching.    Pertinent History s/p rotator cuff repair  on 11/25/2020, with reattached supraspinatus tendon, AC joint arthritis, LBP, migraines, HTN, IBS    Currently in Pain? Yes    Pain Score 3     Pain Location Shoulder    Pain Orientation Left    Pain Type Surgical pain    Pain Onset More than a month ago  [facility] Adult PT Treatment/Exercise - 01/31/21 0001      Exercises   Exercises Shoulder      Shoulder Exercises: Sidelying   External Rotation --    External Rotation Weight (lbs) --    ABduction --   2x10     Shoulder Exercises: Standing   External Rotation Strengthening;Left;10 reps   2 sets   External Rotation Limitations 5# BATCA    Internal Rotation Strengthening;Left;10 reps   2 sets   Internal Rotation Limitations 5# BATCA    Flexion Strengthening;Left;10 reps;Theraband   3 x 10   Theraband Level (Shoulder Flexion) Level 2 (Red)    ABduction AROM;10 reps    Extension Strengthening;Left;10 reps    Extension Limitations 5# BATCA    2 sets     Shoulder Exercises: ROM/Strengthening   UBE (Upper Arm Bike) 8 minutes total (alternating forward and back every 2 minutes)    Other ROM/Strengthening Exercises BATCA Row: 15# 3x10      Modalities   Modalities Vasopneumatic      Vasopneumatic   Number Minutes Vasopneumatic  10 minutes    Vasopnuematic Location  Shoulder    Vasopneumatic Pressure Medium    Vasopneumatic Temperature  34                    PT Short Term Goals - 01/10/21 1203      PT SHORT TERM GOAL #1   Title Pt will be independent in his HEP.    Status Achieved      PT SHORT TERM GOAL #2   Title Pt will be able to tolreate left shoulder flexion >/= 90 degrees passively.    Status Achieved             PT Long Term Goals - 01/31/21 1217      PT LONG TERM GOAL #1   Title Pt will be albe to improve his L shoulder flexion to >/= 150 degrees.    Status Achieved      PT LONG TERM GOAL #2   Title Pt will improve his left shoulder External Rotation to >/= 70 degrees.    Status Achieved      PT LONG TERM GOAL #3   Title Pt will be able to improve his Left grip strength to >/= 130 ppsi.    Status On-going      PT LONG TERM GOAL #4   Title Pt will improve his FOTO score to >/= 61.    Status On-going      PT LONG TERM GOAL #5   Title Pt will improve his left shoulder abduction to >/= 165 degrees with no pain reported.    Status On-going      PT LONG TERM GOAL #7   Title Pt will improve left shoulder  strength to >/= 4+/5.    Status On-going                 Plan - 01/31/21 1214    Clinical Impression Statement Pt making progress with strengthening, progressing to weighted machines. No reports of increased pain during session. We discussed trying BFR at next visit. Continue PT to progress toward pt's maximum funciton.    Personal Factors and Comorbidities Comorbidity 2    Comorbidities HTN, migraines, IBS, LBP    Examination-Activity Limitations Dressing;Sleep;Lift;Other     Examination-Participation Restrictions Cleaning;Community Activity;Other;Yard Work    Stability/Clinical Decision Making Stable/Uncomplicated    Rehab Potential Good    PT Frequency Other (  comment)    PT Duration 6 weeks    PT Treatment/Interventions ADLs/Self Care Home Management;Cryotherapy;Electrical Stimulation;Therapeutic activities;Functional mobility training;Therapeutic exercise;Neuromuscular re-education;Patient/family education;Manual techniques;Passive range of motion;Vasopneumatic Device;Taping;Ultrasound    PT Next Visit Plan consider BFR next visit. Pt released to being L shoulder active ROM and strengthening    PT Home Exercise Plan Access Code: [email or web address]    Consulted and Agree with Plan of Care Patient           Patient will benefit from skilled therapeutic intervention in order to improve the following deficits and impairments:  Pain,Impaired UE functional use,Postural dysfunction,Impaired flexibility,Increased edema,Decreased range of motion,Decreased strength  Visit Diagnosis: Stiffness of left shoulder, not elsewhere classified  Muscle weakness (generalized)  Acute pain of left shoulder  Localized edema  Abnormal posture     Problem List Patient Active Problem List   Diagnosis Date Noted  . Pain in left shoulder 06/24/2019  . Low back pain 06/24/2019  . Classic migraine with aura 11/11/2013  . Dizziness and giddiness 11/11/2013  . Coital headache 11/11/2013    Sharmon Leyden, PT, MPT 01/31/2021, 12:55 PM  [facility] Physical Therapy [address] [place], [place], [address] Phone: [phone]   Fax:  [phone]  Name: Gary Fitzgerald MRN: [id number] Date of Birth: Oct 07, 1966

## 2021-02-02 ENCOUNTER — Encounter: Payer: BC Managed Care – PPO | Admitting: Physical Therapy

## 2021-02-02 ENCOUNTER — Telehealth: Payer: Self-pay | Admitting: Physical Therapy

## 2021-02-02 NOTE — Telephone Encounter (Signed)
I called to follow up after pt missed his 11:00am physical therapy visit today. Pt stated he thought his appointment was at 11:45am. Pt was reminded of his upcoming visit on Monday at 11:45am.  Pt was also encouraged to continue with his strengthening and stretching HEP.    Narda Amber, PT, MPT 02/02/21 11:25 AM

## 2021-02-07 ENCOUNTER — Encounter: Payer: Self-pay | Admitting: Physical Therapy

## 2021-02-07 ENCOUNTER — Ambulatory Visit (INDEPENDENT_AMBULATORY_CARE_PROVIDER_SITE_OTHER): Payer: BC Managed Care – PPO | Admitting: Physical Therapy

## 2021-02-07 ENCOUNTER — Other Ambulatory Visit: Payer: Self-pay

## 2021-02-07 DIAGNOSIS — M6281 Muscle weakness (generalized): Secondary | ICD-10-CM

## 2021-02-07 DIAGNOSIS — M25512 Pain in left shoulder: Secondary | ICD-10-CM

## 2021-02-07 DIAGNOSIS — M25612 Stiffness of left shoulder, not elsewhere classified: Secondary | ICD-10-CM

## 2021-02-07 DIAGNOSIS — R293 Abnormal posture: Secondary | ICD-10-CM

## 2021-02-07 DIAGNOSIS — R600 Localized edema: Secondary | ICD-10-CM

## 2021-02-07 NOTE — Therapy (Signed)
[facility] Physical Therapy [address] [place], [place], [address] Phone: [phone]   Fax:  [phone]  Physical Therapy Treatment  Patient Details  Name: Gary Fitzgerald MRN: [id number] Date of Birth: [birth year] Referring Provider (PT): Norlene Campbell MD   Encounter Date: 02/07/2021   PT End of Session - 02/07/21 1158    Visit Number 19    Number of Visits 30    Date for PT Re-Evaluation 01/31/21    Authorization Type 60 visits/year    Authorization Time Period Recert sent for additional 8 weeks and 12 visits on 01/26/2021    PT Start Time 1149    PT Stop Time 1230    PT Time Calculation (min) 41 min    Activity Tolerance Patient tolerated treatment well;No increased pain    Behavior During Therapy WFL for tasks assessed/performed           Past Medical History:  Diagnosis Date  . Classic migraine with aura 11/11/2013  . 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 Dizziness   . Headache(784.0)   . Hearing loss    asymmetrical  . History of kidney stones   . Hypertension   . Irritable bowel syndrome   . PONV (postoperative nausea and vomiting)   . Renal calculi    Lithotripsy  . Testosterone deficiency     Past Surgical History:  Procedure Laterality Date  . ANTERIOR CRUCIATE LIGAMENT REPAIR     x2  . EXTRACORPOREAL SHOCK WAVE LITHOTRIPSY Left 03/05/2017   Procedure: LEFT EXTRACORPOREAL SHOCK WAVE LITHOTRIPSY (ESWL);  Surgeon: Jerilee Field, MD;  [facility];  Service: Urology;  Laterality: Left;  . EXTRACORPOREAL SHOCK WAVE LITHOTRIPSY Right 11/26/2017   Procedure: RIGHT EXTRACORPOREAL SHOCK WAVE LITHOTRIPSY (ESWL);  Surgeon: Rene Paci, MD;  [facility];  Service: Urology;  Laterality: Right;  . EXTRACORPOREAL SHOCK WAVE LITHOTRIPSY Left 12/05/2018   Procedure: EXTRACORPOREAL SHOCK WAVE LITHOTRIPSY (ESWL);  Surgeon: Ihor Gully, MD;  [facility];  Service: Urology;  Laterality: Left;  . EXTRACORPOREAL SHOCK WAVE  LITHOTRIPSY Right 07/26/2020   Procedure: EXTRACORPOREAL SHOCK WAVE LITHOTRIPSY (ESWL);  Surgeon: Marcine Matar, MD;  Location: [facility];  Service: Urology;  Laterality: Right;  . EYE SURGERY     lasik surgery bilat   . HIP SURGERY Right   . KNEE SURGERY Left    x2 left knee  . NERVE, TENDON AND ARTERY REPAIR Right 10/14/2019   Procedure: WOUND EXPLORATION REPAIR EXTENSOR TENDON RIGHT THUMB WITH POSSIBLE EXTENSOR INDICUS PROPRIUS TO EXTENSOR POLICUS LONGUS TRANSFER;  Surgeon: Betha Loa, MD;  Location: [facility];  Service: Orthopedics;  Laterality: Right;  . SHOULDER SURGERY     Bilateral  . Thumb surgery     Tendon repair, right    There were no vitals filed for this visit.   Subjective Assessment - 02/07/21 1158    Subjective Pt arriving reporting no pain.    Pertinent History s/p rotator cuff repair  on 11/25/2020, with reattached supraspinatus tendon, AC joint arthritis, LBP, migraines, HTN, IBS    Limitations Lifting;House hold activities;Other (comment)    How long can you sit comfortably? unlimited    How long can you stand comfortably? unlimited    How long can you walk comfortably? unlimited    Diagnostic tests X-ray    Patient Stated Goals Get back to work without pain    Currently in Pain? No/denies  [facility] Adult PT Treatment/Exercise - 02/07/21 0001      Shoulder Exercises: Standing   External Rotation Strengthening;Left;10 reps   2 sets   External Rotation Limitations 5# BATCA    Internal Rotation Strengthening;Left;10 reps   3 sets of 10   Internal Rotation Limitations 10# BATCA    Flexion Strengthening;Left;10 reps;Theraband   3 x 10   Extension Strengthening;Left;10 reps    Extension Limitations 10# BATCA   3 sets of 10     Shoulder Exercises: ROM/Strengthening   UBE (Upper Arm Bike) 8 minutes total (alternating forward and back every 2 minutes)    Pushups 5 reps   2 sets    Other ROM/Strengthening Exercises BATCA Row: 20# 3x10      Modalities   Modalities Vasopneumatic      Vasopneumatic   Number Minutes Vasopneumatic  10 minutes    Vasopnuematic Location  Shoulder    Vasopneumatic Pressure Medium    Vasopneumatic Temperature  34                    PT Short Term Goals - 02/07/21 1221      PT SHORT TERM GOAL #1   Title Pt will be independent in his HEP.    Status Achieved      PT SHORT TERM GOAL #2   Title Pt will be able to tolreate left shoulder flexion >/= 90 degrees passively.    Status Achieved             PT Long Term Goals - 02/07/21 1221      PT LONG TERM GOAL #1   Title Pt will be albe to improve his L shoulder flexion to >/= 150 degrees.    Status On-going      PT LONG TERM GOAL #2   Title Pt will improve his left shoulder External Rotation to >/= 70 degrees.    Status On-going      PT LONG TERM GOAL #3   Title Pt will be able to improve his Left grip strength to >/= 130 ppsi.    Baseline L grip strength 107 ppsi on 12/06/2020, L grip strength 114ppsi on 01/03/2021, L grip strength: 114 ppsi on 01/26/2021.    Status On-going      PT LONG TERM GOAL #4   Title Pt will improve his FOTO score to >/= 61.    Status On-going      PT LONG TERM GOAL #5   Title Pt will improve his left shoulder abduction to >/= 165 degrees with no pain reported.    Status On-going      PT LONG TERM GOAL #7   Title Pt will improve left shoulder  strength to >/= 4+/5.    Status On-going                 Plan - 02/07/21 1159    Clinical Impression Statement Pt making progress with strengthening and AROM using weight machines. Pt with mild increase in pain during exercises more fatigue noted. Consider BFR at next visit. Conitnue to progress toward pt's goals set.    Personal Factors and Comorbidities Comorbidity 2    Comorbidities HTN, migraines, IBS, LBP    Examination-Activity Limitations Dressing;Sleep;Lift;Other     Examination-Participation Restrictions Cleaning;Community Activity;Other;Yard Work    Stability/Clinical Decision Making Stable/Uncomplicated    Rehab Potential Good    PT Frequency Other (comment)    PT Duration 6 weeks    PT Treatment/Interventions ADLs/Self Care  Home Management;Cryotherapy;Electrical Stimulation;Therapeutic activities;Functional mobility training;Therapeutic exercise;Neuromuscular re-education;Patient/family education;Manual techniques;Passive range of motion;Vasopneumatic Device;Taping;Ultrasound    PT Next Visit Plan consider BFR next visit. Pt released to being L shoulder active ROM and strengthening    PT Home Exercise Plan Access Code: [email or web address]    Consulted and Agree with Plan of Care Patient           Patient will benefit from skilled therapeutic intervention in order to improve the following deficits and impairments:  Pain,Impaired UE functional use,Postural dysfunction,Impaired flexibility,Increased edema,Decreased range of motion,Decreased strength  Visit Diagnosis: Stiffness of left shoulder, not elsewhere classified  Muscle weakness (generalized)  Acute pain of left shoulder  Localized edema  Abnormal posture     Problem List Patient Active Problem List   Diagnosis Date Noted  . Pain in left shoulder 06/24/2019  . Low back pain 06/24/2019  . Classic migraine with aura 11/11/2013  . Dizziness and giddiness 11/11/2013  . Coital headache 11/11/2013    Sharmon Leyden, PT, MPT 02/07/2021, 12:23 PM  [facility] Physical Therapy [address] [place], [place], [address] Phone: [phone]   Fax:  [phone]  Name: Gary Fitzgerald MRN: [id number] Date of Birth: 08-Jan-1966

## 2021-02-09 ENCOUNTER — Encounter: Payer: BC Managed Care – PPO | Admitting: Physical Therapy

## 2021-02-10 ENCOUNTER — Ambulatory Visit: Payer: BC Managed Care – PPO | Admitting: Orthopaedic Surgery

## 2021-02-10 ENCOUNTER — Other Ambulatory Visit: Payer: Self-pay

## 2021-02-10 ENCOUNTER — Encounter: Payer: Self-pay | Admitting: Orthopaedic Surgery

## 2021-02-10 VITALS — Ht 69.5 in | Wt 201.0 lb

## 2021-02-10 DIAGNOSIS — G8929 Other chronic pain: Secondary | ICD-10-CM

## 2021-02-10 DIAGNOSIS — M25512 Pain in left shoulder: Secondary | ICD-10-CM

## 2021-02-10 NOTE — Progress Notes (Signed)
Office Visit Note   Patient: Gary Fitzgerald           Date of Birth: Sep 29, 1966           MRN: [id number] Visit Date: 02/10/2021              Requested by: Ronnald Collum [facility]  [address] [place],  [place] [address] PCP: Coralee Rud, PA-C   Assessment & Plan: Visit Diagnoses:  1. Chronic left shoulder pain     Plan: 2-1/2 months status post left shoulder arthroscopic SCD, DCR and mini open rotator cuff tear repair.  Doing quite well.  He has finished physical therapy but he would like to have a few more sessions.  I think that would be perfectly okay.  He does have a home exercise program.  He feels like he is a little bit stiff but he had excellent overhead motion.  He still needs to work on his strength.  He is done well and feels like he is much better than he was before surgery.  He will gradually work on his exercises and return in a month if still having a problem otherwise as needed  Follow-Up Instructions: Return if symptoms worsen or fail to improve.   Orders:  No orders of the defined types were placed in this encounter.  No orders of the defined types were placed in this encounter.     Procedures: No procedures performed   Clinical Data: No additional findings.   Subjective: Chief Complaint  Patient presents with  . Left Shoulder - Follow-up, Pain    Left shoulder arthroscopy 11/25/2020  Patient presents today for follow up on his left shoulder. He had a left shoulder arthroscopy on 11/25/2020. He said that it is still stiff and sore. He has finished physical therapy, but they think that he could benefit from more visits.   HPI  Review of Systems   Objective: Vital Signs: Ht 5' 9.5" (1.765 m)   Wt 201 lb (91.2 kg)   BMI 29.26 kg/m   Physical Exam  Ortho Exam left shoulder with quick overhead motion.  He has just about full flexion and abduction just a little bit of loss but not significant.  Negative  impingement  No pain over the [facility] joint or the anterior subacromial region.  Speeds sign negative.  Good strength.  Good grip and release  Specialty Comments:  No specialty comments available.  Imaging: No results found.   PMFS History: Patient Active Problem List   Diagnosis Date Noted  . Pain in left shoulder 06/24/2019  . Low back pain 06/24/2019  . Classic migraine with aura 11/11/2013  . Dizziness and giddiness 11/11/2013  . Coital headache 11/11/2013   Past Medical History:  Diagnosis Date  . Classic migraine with aura 11/11/2013  . 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 Dizziness   . Headache(784.0)   . Hearing loss    asymmetrical  . History of kidney stones   . Hypertension   . Irritable bowel syndrome   . PONV (postoperative nausea and vomiting)   . Renal calculi    Lithotripsy  . Testosterone deficiency     Family History  Problem Relation Age of Onset  . High blood pressure Mother   . Hypertension Mother   . High blood pressure Father   . Cancer - Prostate Father   . Skin cancer Father   . Hypertension Father   . Hypertension Brother   .  Diabetes Brother     Past Surgical History:  Procedure Laterality Date  . ANTERIOR CRUCIATE LIGAMENT REPAIR     x2  . EXTRACORPOREAL SHOCK WAVE LITHOTRIPSY Left 03/05/2017   Procedure: LEFT EXTRACORPOREAL SHOCK WAVE LITHOTRIPSY (ESWL);  Surgeon: Jerilee Field, MD;  [facility];  Service: Urology;  Laterality: Left;  . EXTRACORPOREAL SHOCK WAVE LITHOTRIPSY Right 11/26/2017   Procedure: RIGHT EXTRACORPOREAL SHOCK WAVE LITHOTRIPSY (ESWL);  Surgeon: Rene Paci, MD;  [facility];  Service: Urology;  Laterality: Right;  . EXTRACORPOREAL SHOCK WAVE LITHOTRIPSY Left 12/05/2018   Procedure: EXTRACORPOREAL SHOCK WAVE LITHOTRIPSY (ESWL);  Surgeon: Ihor Gully, MD;  [facility];  Service: Urology;  Laterality: Left;  . EXTRACORPOREAL SHOCK WAVE LITHOTRIPSY Right 07/26/2020   Procedure: EXTRACORPOREAL SHOCK  WAVE LITHOTRIPSY (ESWL);  Surgeon: Marcine Matar, MD;  Location: [facility];  Service: Urology;  Laterality: Right;  . EYE SURGERY     lasik surgery bilat   . HIP SURGERY Right   . KNEE SURGERY Left    x2 left knee  . NERVE, TENDON AND ARTERY REPAIR Right 10/14/2019   Procedure: WOUND EXPLORATION REPAIR EXTENSOR TENDON RIGHT THUMB WITH POSSIBLE EXTENSOR INDICUS PROPRIUS TO EXTENSOR POLICUS LONGUS TRANSFER;  Surgeon: Betha Loa, MD;  Location: [facility];  Service: Orthopedics;  Laterality: Right;  . SHOULDER SURGERY     Bilateral  . Thumb surgery     Tendon repair, right   Social History   Occupational History    Comment: [employer]  Tobacco Use  . Smoking status: Never Smoker  . Smokeless tobacco: Never Used  Vaping Use  . Vaping Use: Never used  Substance and Sexual Activity  . Alcohol use: No  . Drug use: Not Currently  . Sexual activity: Not on file

## 2021-03-01 ENCOUNTER — Ambulatory Visit: Payer: BC Managed Care – PPO | Admitting: Physical Therapy

## 2021-03-01 ENCOUNTER — Other Ambulatory Visit: Payer: Self-pay

## 2021-03-01 DIAGNOSIS — M25512 Pain in left shoulder: Secondary | ICD-10-CM | POA: Diagnosis not present

## 2021-03-01 DIAGNOSIS — M6281 Muscle weakness (generalized): Secondary | ICD-10-CM | POA: Diagnosis not present

## 2021-03-01 DIAGNOSIS — M25612 Stiffness of left shoulder, not elsewhere classified: Secondary | ICD-10-CM | POA: Diagnosis not present

## 2021-03-01 DIAGNOSIS — R293 Abnormal posture: Secondary | ICD-10-CM

## 2021-03-01 DIAGNOSIS — R600 Localized edema: Secondary | ICD-10-CM | POA: Diagnosis not present

## 2021-03-01 NOTE — Therapy (Addendum)
[place], [place], [address] Phone: [phone]   Fax:  [phone]  Physical Therapy Treatment/Discharge  Patient Details  Name: Gary Fitzgerald MRN: [id number] Date of Birth: 09-27-1966 Referring Provider (PT): Joni Fears MD   Encounter Date: 03/01/2021   PT End of Session - 03/01/21 1525     Visit Number 20    Number of Visits 30    Date for PT Re-Evaluation 03/25/21    Authorization Time Period Recert sent for additional 8 weeks and 12 visits on 01/26/2021    PT Start Time 2951    PT Stop Time 1600    PT Time Calculation (min) 44 min    Activity Tolerance Patient tolerated treatment well;No increased pain    Behavior During Therapy [facility] for tasks assessed/performed             Past Medical History:  Diagnosis Date   Classic migraine with aura 11/11/2013   Coital headache 11/11/2013   Dizziness    Headache(784.0)    Hearing loss    asymmetrical   History of kidney stones    Hypertension    Irritable bowel syndrome    PONV (postoperative nausea and vomiting)    Renal calculi    Lithotripsy   Testosterone deficiency     Past Surgical History:  Procedure Laterality Date   ANTERIOR CRUCIATE LIGAMENT REPAIR     x2   EXTRACORPOREAL SHOCK WAVE LITHOTRIPSY Left 03/05/2017   Procedure: LEFT EXTRACORPOREAL SHOCK WAVE LITHOTRIPSY (ESWL);  Surgeon: Festus Aloe, MD;  Location: [facility];  Service: Urology;  Laterality: Left;   EXTRACORPOREAL SHOCK WAVE LITHOTRIPSY Right 11/26/2017   Procedure: RIGHT EXTRACORPOREAL SHOCK WAVE LITHOTRIPSY (ESWL);  Surgeon: Ceasar Mons, MD;  Location: [facility];  Service: Urology;  Laterality: Right;   EXTRACORPOREAL SHOCK WAVE LITHOTRIPSY Left 12/05/2018   Procedure: EXTRACORPOREAL SHOCK WAVE LITHOTRIPSY (ESWL);  Surgeon: Kathie Rhodes, MD;  Location: [facility];  Service: Urology;  Laterality: Left;   EXTRACORPOREAL SHOCK WAVE LITHOTRIPSY Right 07/26/2020    Procedure: EXTRACORPOREAL SHOCK WAVE LITHOTRIPSY (ESWL);  Surgeon: Franchot Gallo, MD;  Location: [facility];  Service: Urology;  Laterality: Right;   EYE SURGERY     lasik surgery bilat    HIP SURGERY Right    KNEE SURGERY Left    x2 left knee   NERVE, TENDON AND ARTERY REPAIR Right 10/14/2019   Procedure: WOUND EXPLORATION REPAIR EXTENSOR TENDON RIGHT THUMB WITH POSSIBLE EXTENSOR INDICUS PROPRIUS TO EXTENSOR POLICUS LONGUS TRANSFER;  Surgeon: Leanora Cover, MD;  Location: [place];  Service: Orthopedics;  Laterality: Right;   SHOULDER SURGERY     Bilateral   Thumb surgery     Tendon repair, right    There were no vitals filed for this visit.                      [place] [facility] PT Treatment/Exercise - 03/01/21 0001       Exercises   Exercises Shoulder      Shoulder Exercises: Standing   External Rotation Strengthening;Left;15 reps   2 sets   External Rotation Limitations 5# BATCA    Internal Rotation Strengthening;Left;10 reps   3 sets of 15   Internal Rotation Limitations 10# BATCA    Flexion Strengthening;Left;10 reps;Theraband   3 x 10   Extension Strengthening;Left;15 reps    Extension Limitations 15# BATCA      Shoulder Exercises: ROM/Strengthening   UBE (Upper Arm  Bike) 8 minutes total (alternating forward and back every 2 minutes) L4.5    Other ROM/Strengthening Exercises BATCA Row: 15# 3 x 15, Left UE only    Other ROM/Strengthening Exercises Plank position shoulder stability elbows extended moving hands narrow and wide positions x 10 reps      Shoulder Exercises: Stretch   Other Shoulder Stretches shouulder flexion using red physioball x 5 holding 10 seconds each                      PT Short Term Goals - 02/07/21 1221       PT SHORT TERM GOAL #1   Title Pt will be independent in his HEP.    Status Achieved      PT SHORT TERM GOAL #2   Title Pt will be able to tolreate left shoulder flexion >/=  90 degrees passively.    Status Achieved               PT Long Term Goals - 03/01/21 1605       PT LONG TERM GOAL #1   Title Pt will be albe to improve his L shoulder flexion to >/= 150 degrees.    Status On-going      PT LONG TERM GOAL #2   Title Pt will improve his left shoulder External Rotation to >/= 70 degrees.    Status On-going      PT LONG TERM GOAL #3   Title Pt will be able to improve his Left grip strength to >/= 130 ppsi.    Baseline L grip strength 107 ppsi on 12/06/2020, L grip strength 114ppsi on 01/03/2021, L grip strength: 114 ppsi on 01/26/2021.    Status On-going      PT LONG TERM GOAL #4   Title Pt will improve his FOTO score to >/= 61.    Status On-going      PT LONG TERM GOAL #5   Title Pt will improve his left shoulder abduction to >/= 165 degrees with no pain reported.    Status On-going      PT LONG TERM GOAL #7   Title Pt will improve left shoulder  strength to >/= 4+/5.    Status On-going                   Plan - 03/01/21 1559     Clinical Impression Statement Pt is making progress with strengtheing and AROM. No pain reported today with exercises performed with increased weighs and frequency. Pt with noticable muscle fatigue in left shoulder. Pt reporting he will have to limit his visits due to high copay and getting lump sum bill for previous visits. Pt issued blue theraband and will follow up in two weeks.    Personal Factors and Comorbidities Comorbidity 2    Comorbidities HTN, migraines, IBS, LBP    Examination-Activity Limitations Dressing;Sleep;Lift;Other    Examination-Participation Restrictions Cleaning;Community Activity;Other;Yard Work    Stability/Clinical Decision Making Stable/Uncomplicated    Rehab Potential Good    PT Duration 6 weeks    PT Treatment/Interventions ADLs/Self Care Home Management;Cryotherapy;Electrical Stimulation;Therapeutic activities;Functional mobility training;Therapeutic exercise;Neuromuscular  re-education;Patient/family education;Manual techniques;Passive range of motion;Vasopneumatic Device;Taping;Ultrasound    PT Next Visit Plan progress gym program plan for discharge in next 1-2 visits    PT Home Exercise Plan Access Code: [email or web address]    Consulted and Agree with Plan of Care Patient             Patient will  benefit from skilled therapeutic intervention in order to improve the following deficits and impairments:  Pain,Impaired UE functional use,Postural dysfunction,Impaired flexibility,Increased edema,Decreased range of motion,Decreased strength  Visit Diagnosis: Stiffness of left shoulder, not elsewhere classified  Muscle weakness (generalized)  Acute pain of left shoulder  Localized edema  Abnormal posture     Problem List Patient Active Problem List   Diagnosis Date Noted   Pain in left shoulder 06/24/2019   Low back pain 06/24/2019   Classic migraine with aura 11/11/2013   Dizziness and giddiness 11/11/2013   Coital headache 11/11/2013    Oretha Caprice, PT, MPT 03/01/2021, 4:06 PM   PHYSICAL THERAPY DISCHARGE SUMMARY  Visits from Start of Care: 20  Current functional level related to goals / functional outcomes: See note   Remaining deficits: See note   Education / Equipment: HEP   Patient agrees to discharge. Patient goals were partially met. Patient is being discharged due to not returning since the last visit.  Scot Jun, PT, DPT, OCS, ATC 04/07/21  1:49 PM    [facility] [address] [place], [place], [address] Phone: [phone]   Fax:  [phone]  Name: Gary Fitzgerald MRN: [id number] Date of Birth: [birth year]

## 2021-03-08 ENCOUNTER — Encounter: Payer: BC Managed Care – PPO | Admitting: Physical Therapy

## 2021-03-15 ENCOUNTER — Encounter: Payer: BC Managed Care – PPO | Admitting: Physical Therapy

## 2021-03-22 ENCOUNTER — Encounter: Payer: BC Managed Care – PPO | Admitting: Physical Therapy

## 2021-04-13 ENCOUNTER — Other Ambulatory Visit: Payer: Self-pay | Admitting: Orthopaedic Surgery

## 2021-04-13 DIAGNOSIS — M25512 Pain in left shoulder: Secondary | ICD-10-CM

## 2022-07-18 IMAGING — DX DG ABDOMEN 1V
2 series · 2 of 2 positions shown · non-contrast
Comparison: 07/13/2020

CLINICAL DATA: Nephrolithiasis.

EXAM:
ABDOMEN - 1 VIEW

[abdomen kub (1 of 2)]
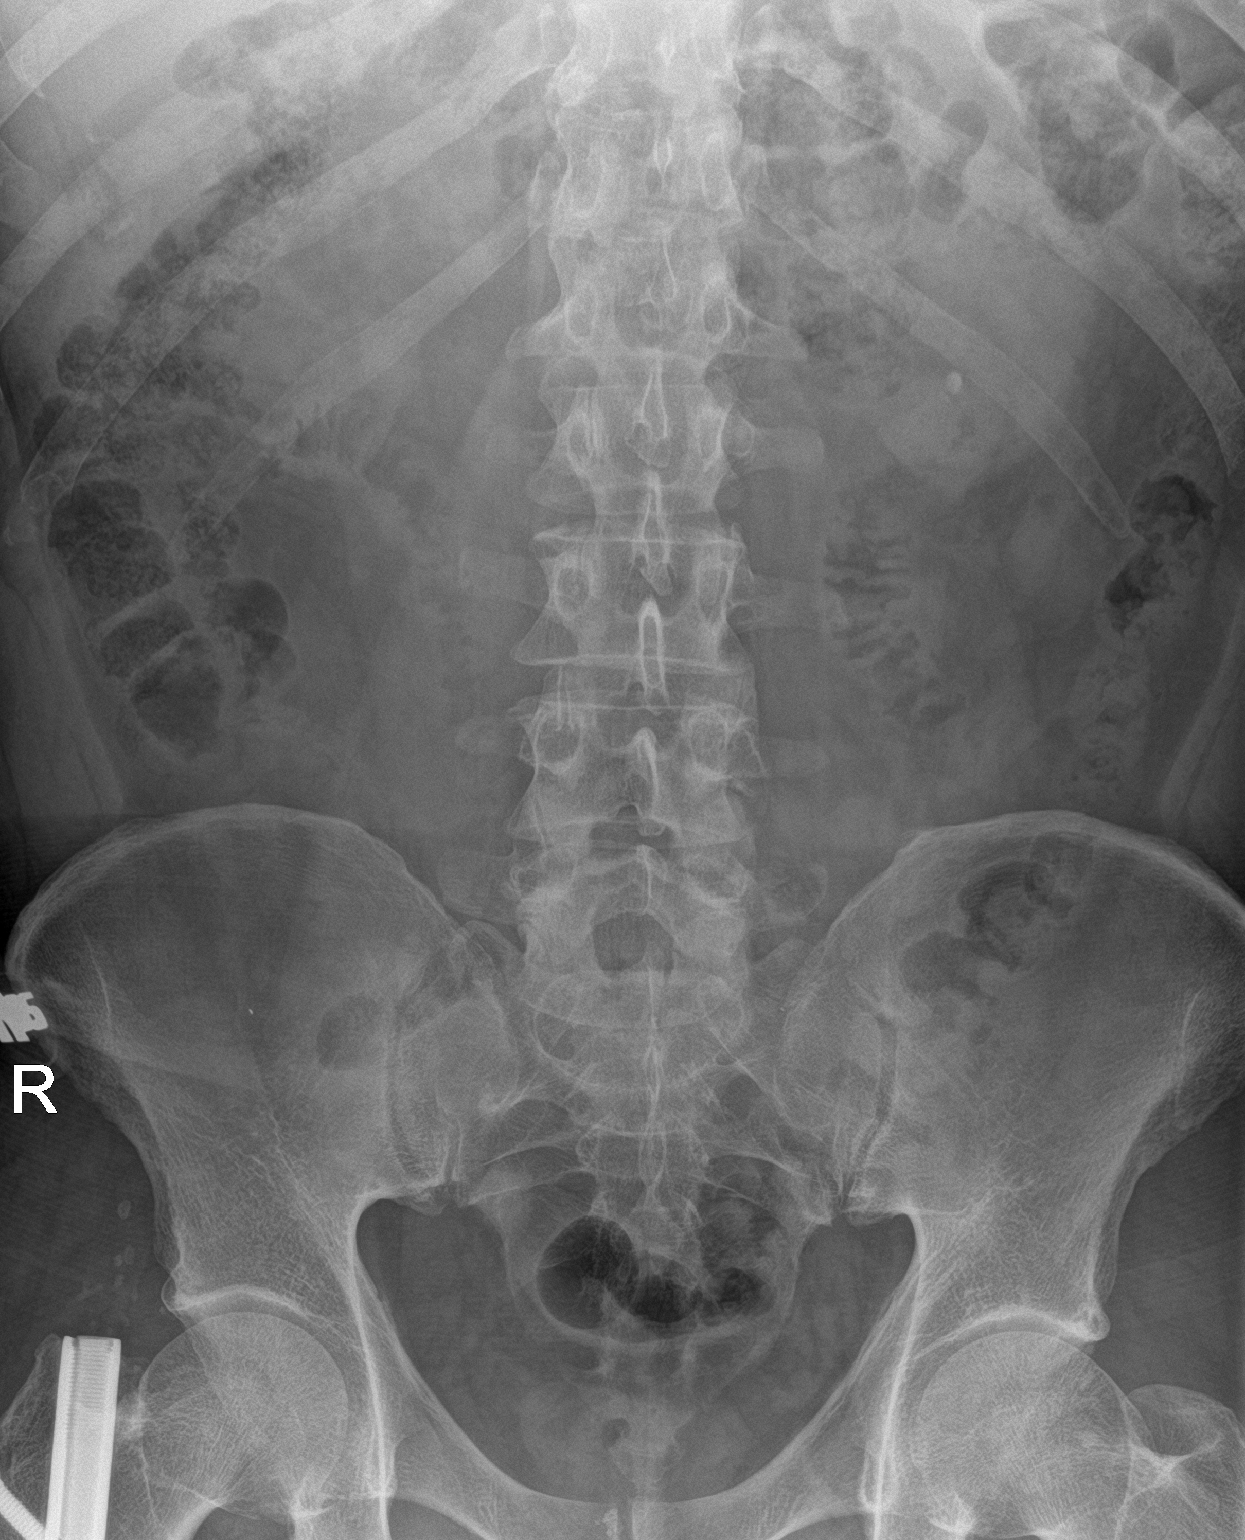

[abdomen kub (2 of 2)]
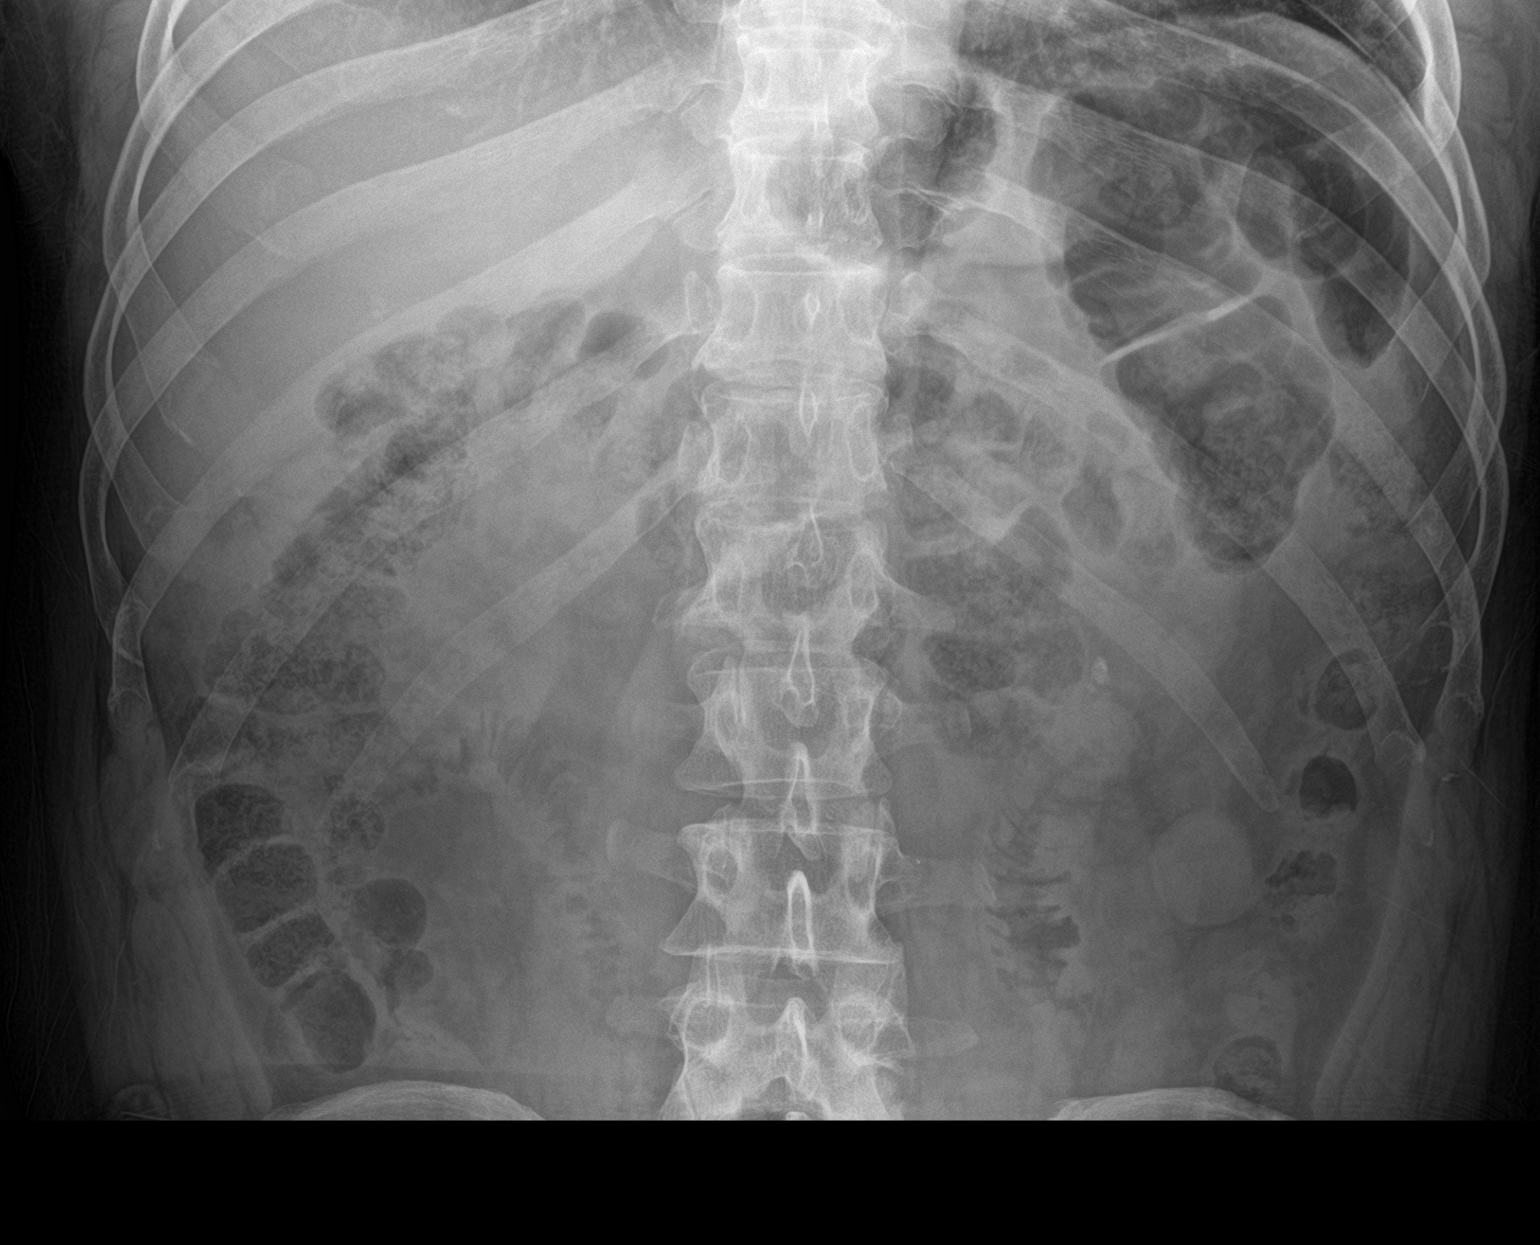

[2 of 2 positions shown; findings below may reference images not displayed]

FINDINGS: Two adjacent calculi are seen in the lower pole of the left kidney,
which measure 4 mm and 3 mm in maximum diameter. N O other definite
radiopaque calculi identified. Bowel gas pattern is normal.
IMPRESSION: Two adjacent calculi in lower pole of left kidney, which measure 4
mm and 3 mm in maximum diameter.
